# Patient Record
Sex: Male | Born: 1980
Health system: Southern US, Community
[De-identification: ages and names within clinical notes are randomized; demographics above are authoritative.]

## PROBLEM LIST (undated history)

## (undated) DIAGNOSIS — G4733 Obstructive sleep apnea (adult) (pediatric): Secondary | ICD-10-CM

## (undated) DIAGNOSIS — T7840XA Allergy, unspecified, initial encounter: Secondary | ICD-10-CM

## (undated) DIAGNOSIS — B019 Varicella without complication: Secondary | ICD-10-CM

## (undated) DIAGNOSIS — N200 Calculus of kidney: Secondary | ICD-10-CM

## (undated) HISTORY — DX: Calculus of kidney: N20.0

## (undated) HISTORY — DX: Obstructive sleep apnea (adult) (pediatric): G47.33

## (undated) HISTORY — DX: Varicella without complication: B01.9

## (undated) HISTORY — DX: Allergy, unspecified, initial encounter: T78.40XA

## (undated) HISTORY — PX: HERNIA REPAIR: SHX51

---
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
## 1998-02-17 HISTORY — PX: ANTERIOR CRUCIATE LIGAMENT REPAIR: SHX115

## 2008-03-13 ENCOUNTER — Ambulatory Visit: Payer: Self-pay | Admitting: Pulmonary Disease

## 2008-03-13 DIAGNOSIS — G4733 Obstructive sleep apnea (adult) (pediatric): Secondary | ICD-10-CM | POA: Insufficient documentation

## 2008-03-29 ENCOUNTER — Encounter: Payer: Self-pay | Admitting: Pulmonary Disease

## 2008-03-29 ENCOUNTER — Ambulatory Visit (HOSPITAL_BASED_OUTPATIENT_CLINIC_OR_DEPARTMENT_OTHER): Admission: RE | Admit: 2008-03-29 | Discharge: 2008-03-29 | Payer: Self-pay | Admitting: Pulmonary Disease

## 2008-04-05 ENCOUNTER — Ambulatory Visit: Payer: Self-pay | Admitting: Pulmonary Disease

## 2008-04-26 ENCOUNTER — Ambulatory Visit: Payer: Self-pay | Admitting: Pulmonary Disease

## 2010-07-05 NOTE — Procedures (Signed)
Henry Garner, Henry Garner NO.:  [id number]   MEDICAL RECORD NO.:  [id number]          PATIENT TYPE:  OUT   LOCATION:  SLEEP CENTER                 FACILITY:  [facility]   PHYSICIAN:  Barbaraann Share, MD,FCCPDATE OF BIRTH:  03-04-1978   DATE OF STUDY:  04/05/2008                            NOCTURNAL POLYSOMNOGRAM   REFERRING PHYSICIAN:   LOCATION:  Sleep lab.   REFERRING PHYSICIAN:  Dr. Barbaraann Share, MD, [facility].   DATE OF STUDY:  March 29, 2008.   INDICATION FOR THE STUDY:  Hypersomnia with sleep apnea.   EPWORTH SCORE:  14.   SLEEP ARCHITECTURE:  The patient had a total sleep time of only 143  minutes with no slow wave sleep or REM.  Sleep onset latency was very  prolonged at 133 minutes, and sleep efficiency was very poor at 40%.   RESPIRATORY DATA:  The patient was found to have 8 apneas and 40  obstructive hypopneas for an apnea/hypopnea index of 20 events per hour.  The events occurred in all body positions and there was loud snoring  noted throughout.   OXYGEN DATA:  There was O2 desaturation as low as 88% with the patient's  obstructive events.   CARDIAC DATA:  No clinically significant arrhythmias were noted.   MOVEMENT/PARASOMNIA:  The patient had no significant leg jerks or  abnormal behavior seen.   IMPRESSION/RECOMMENDATIONS:  1. Moderate obstructive sleep apnea/hypopnea syndrome with an apnea-      hypopnea index of 20 events per hour and desaturation as low as      88%.  It should also be noted the patient never achieved REM or      slow wave sleep      and therefore his degree of sleep apnea may be underestimated.      Treatment for this degree of sleep apnea can include weight loss      alone if applicable, upper airway surgery, oral appliance, and also      CPAP.      Barbaraann Share, MD,FCCP  Diplomate, American Board of Sleep  Medicine  Electronically Signed     [facility]/MED[facility]  D:  04/05/2008 12:47:31  T:  04/06/2008 00:43:30  Job:   [id number]

## 2013-02-17 HISTORY — PX: VASECTOMY: SHX75

## 2014-06-18 ENCOUNTER — Encounter (HOSPITAL_BASED_OUTPATIENT_CLINIC_OR_DEPARTMENT_OTHER): Payer: Self-pay | Admitting: Emergency Medicine

## 2014-06-18 ENCOUNTER — Emergency Department (HOSPITAL_BASED_OUTPATIENT_CLINIC_OR_DEPARTMENT_OTHER): Payer: BLUE CROSS/BLUE SHIELD

## 2014-06-18 ENCOUNTER — Emergency Department (HOSPITAL_BASED_OUTPATIENT_CLINIC_OR_DEPARTMENT_OTHER)
Admission: EM | Admit: 2014-06-18 | Discharge: 2014-06-18 | Disposition: A | Discharge status: Home / Self Care | Payer: BLUE CROSS/BLUE SHIELD | Attending: Emergency Medicine | Admitting: Emergency Medicine

## 2014-06-18 DIAGNOSIS — R109 Unspecified abdominal pain: Secondary | ICD-10-CM | POA: Diagnosis present

## 2014-06-18 DIAGNOSIS — Z880 Allergy status to penicillin: Secondary | ICD-10-CM | POA: Insufficient documentation

## 2014-06-18 DIAGNOSIS — N201 Calculus of ureter: Secondary | ICD-10-CM

## 2014-06-18 DIAGNOSIS — Z791 Long term (current) use of non-steroidal anti-inflammatories (NSAID): Secondary | ICD-10-CM | POA: Insufficient documentation

## 2014-06-18 LAB — CBC WITH DIFFERENTIAL/PLATELET
BASOS ABS: 0 10*3/uL (ref 0.0–0.1)
Basophils Relative: 0 % (ref 0–1)
EOS PCT: 0 % (ref 0–5)
Eosinophils Absolute: 0 10*3/uL (ref 0.0–0.7)
HEMATOCRIT: 44.7 % (ref 39.0–52.0)
HEMOGLOBIN: 15.5 g/dL (ref 13.0–17.0)
LYMPHS PCT: 14 % (ref 12–46)
Lymphs Abs: 1.6 10*3/uL (ref 0.7–4.0)
MCH: 29.6 pg (ref 26.0–34.0)
MCHC: 34.7 g/dL (ref 30.0–36.0)
MCV: 85.5 fL (ref 78.0–100.0)
Monocytes Absolute: 0.6 10*3/uL (ref 0.1–1.0)
Monocytes Relative: 5 % (ref 3–12)
NEUTROS ABS: 9.3 10*3/uL — AB (ref 1.7–7.7)
Neutrophils Relative %: 81 % — ABNORMAL HIGH (ref 43–77)
Platelets: 207 10*3/uL (ref 150–400)
RBC: 5.23 MIL/uL (ref 4.22–5.81)
RDW: 13 % (ref 11.5–15.5)
WBC: 11.5 10*3/uL — AB (ref 4.0–10.5)

## 2014-06-18 LAB — URINALYSIS, ROUTINE W REFLEX MICROSCOPIC
Bilirubin Urine: NEGATIVE
GLUCOSE, UA: NEGATIVE mg/dL
Ketones, ur: NEGATIVE mg/dL
Leukocytes, UA: NEGATIVE
NITRITE: NEGATIVE
Protein, ur: NEGATIVE mg/dL
SPECIFIC GRAVITY, URINE: 1.026 (ref 1.005–1.030)
UROBILINOGEN UA: 0.2 mg/dL (ref 0.0–1.0)
pH: 6 (ref 5.0–8.0)

## 2014-06-18 LAB — BASIC METABOLIC PANEL
Anion gap: 4 — ABNORMAL LOW (ref 5–15)
BUN: 17 mg/dL (ref 6–20)
CALCIUM: 8.9 mg/dL (ref 8.9–10.3)
CO2: 26 mmol/L (ref 22–32)
CREATININE: 0.91 mg/dL (ref 0.61–1.24)
Chloride: 105 mmol/L (ref 101–111)
GFR calc non Af Amer: >60 mL/min (ref >60)
Glucose, Bld: 139 mg/dL — ABNORMAL HIGH (ref 70–99)
Potassium: 4.2 mmol/L (ref 3.5–5.1)
Sodium: 135 mmol/L (ref 135–145)

## 2014-06-18 LAB — URINE MICROSCOPIC-ADD ON

## 2014-06-18 MED ORDER — ONDANSETRON HCL 4 MG/2ML IJ SOLN
4.0000 mg | Freq: Once | INTRAMUSCULAR | Status: AC
  Administered 2014-06-18: 4 mg via INTRAVENOUS
  Filled 2014-06-18: qty 2

## 2014-06-18 MED ORDER — TAMSULOSIN HCL 0.4 MG PO CAPS: 0.4000 mg | ORAL_CAPSULE | Freq: Every day | ORAL | Status: DC

## 2014-06-18 MED ORDER — MORPHINE SULFATE 4 MG/ML IJ SOLN
4.0000 mg | Freq: Once | INTRAMUSCULAR | Status: AC
  Administered 2014-06-18: 4 mg via INTRAVENOUS
  Filled 2014-06-18: qty 1

## 2014-06-18 MED ORDER — OXYCODONE-ACETAMINOPHEN 5-325 MG PO TABS: 1.0000 | ORAL_TABLET | Freq: Four times a day (QID) | ORAL | Status: DC | PRN

## 2014-06-18 MED ORDER — HYDROMORPHONE HCL 1 MG/ML IJ SOLN
1.0000 mg | Freq: Once | INTRAMUSCULAR | Status: AC
  Administered 2014-06-18: 1 mg via INTRAVENOUS
  Filled 2014-06-18: qty 1

## 2014-06-18 MED ORDER — ONDANSETRON HCL 4 MG PO TABS: 4.0000 mg | ORAL_TABLET | Freq: Three times a day (TID) | ORAL | Status: DC | PRN

## 2014-06-18 MED ORDER — SODIUM CHLORIDE 0.9 % IV BOLUS (SEPSIS)
1000.0000 mL | Freq: Once | INTRAVENOUS | Status: AC
  Administered 2014-06-18: 1000 mL via INTRAVENOUS

## 2014-06-18 NOTE — ED Notes (Signed)
Pt reports sudden onset right flank pain denies hx kidney stone

## 2014-06-18 NOTE — ED Provider Notes (Signed)
CSN: [id number]     Arrival date & time 06/18/14  4098 History   First MD Initiated Contact with Patient 06/18/14 [phone]     Chief Complaint  Patient presents with  . Flank Pain     (Consider location/radiation/quality/duration/timing/severity/associated sxs/prior Treatment) Patient is a 34 y.o. male presenting with abdominal pain.  Abdominal Pain Pain location:  R flank Pain quality: aching   Pain radiates to:  Groin Pain severity:  Severe Onset quality:  Sudden Duration:  5 hours Timing:  Constant Progression:  Partially resolved Chronicity:  New Context: awakening from sleep   Relieved by: ibuprofen. Worsened by:  Nothing tried Associated symptoms: dysuria, nausea and vomiting   Associated symptoms: no fever and no hematuria     History reviewed. No pertinent past medical history. Past Surgical History  Procedure Laterality Date  . Hernia repair    . Cataract extraction    . Anterior cruciate ligament repair    . Hernia repair     History reviewed. No pertinent family history. History  Substance Use Topics  . Smoking status: Never Smoker   . Smokeless tobacco: Never Used  . Alcohol Use: No    Review of Systems  Constitutional: Negative for fever.  Gastrointestinal: Positive for nausea, vomiting and abdominal pain.  Genitourinary: Positive for dysuria and flank pain. Negative for hematuria.  All other systems reviewed and are negative.     Allergies  Amoxapine and related and Penicillins  Home Medications   Prior to Admission medications   Medication Sig Start Date End Date Taking? Authorizing Provider  ibuprofen (ADVIL,MOTRIN) 200 MG tablet Take 800 mg by mouth every 6 (six) hours as needed.   Yes Historical Provider, MD   BP 144/78 mmHg  Pulse 84  Temp(Src) 98.2 F (36.8 C) (Oral)  Resp 20  Ht 5' 10.5" (1.791 m)  Wt 280 lb (127.007 kg)  BMI 39.59 kg/m2  SpO2 100% Physical Exam  Constitutional: He is oriented to person, place, and time. He  appears well-developed and well-nourished. No distress.  HENT:  Head: Normocephalic and atraumatic.  Eyes: Conjunctivae are normal. No scleral icterus.  Neck: Neck supple.  Cardiovascular: Normal rate and intact distal pulses.   Pulmonary/Chest: Effort normal. No stridor. No respiratory distress.  Abdominal: Normal appearance. He exhibits no distension. There is no tenderness. There is CVA tenderness (minimal right). There is no rigidity, no rebound and no guarding.  Neurological: He is alert and oriented to person, place, and time.  Skin: Skin is warm and dry. No rash noted.  Psychiatric: He has a normal mood and affect. His behavior is normal.  Nursing note and vitals reviewed.   ED Course  Procedures (including critical care time) Labs Review Labs Reviewed  URINALYSIS, ROUTINE W REFLEX MICROSCOPIC - Abnormal; Notable for the following:    APPearance CLOUDY (*)    Hgb urine dipstick LARGE (*)    All other components within normal limits  CBC WITH DIFFERENTIAL/PLATELET - Abnormal; Notable for the following:    WBC 11.5 (*)    Neutrophils Relative % 81 (*)    Neutro Abs 9.3 (*)    All other components within normal limits  BASIC METABOLIC PANEL - Abnormal; Notable for the following:    Glucose, Bld 139 (*)    Anion gap 4 (*)    All other components within normal limits  URINE MICROSCOPIC-ADD ON    Imaging Review Ct Renal Stone Study  06/18/2014   CLINICAL DATA:  Acute onset right  flank pain for 1 day.  Hematuria.  EXAM: CT ABDOMEN AND PELVIS WITHOUT CONTRAST  TECHNIQUE: Multidetector CT imaging of the abdomen and pelvis was performed following the standard protocol without oral or intravenous contrast material administration.  COMPARISON:  None.  FINDINGS: There is no edema or consolidation in the lung bases.  No focal liver lesions are identified on this noncontrast enhanced study. Gallbladder wall is not thickened. There is no biliary duct dilatation.  Spleen, pancreas, and  adrenals appear normal.  Left kidney shows no evidence of mass or hydronephrosis. There is no left-sided renal or ureteral calculus. On the right, there is mild hydronephrosis. There is no renal mass. There is a 2 mm nonobstructing calculus in the upper pole right kidney. There is a 2 mm calculus in the right ureterovesical junction. There is a 6 mm phleboliths slightly inferior to the right ureter.  In the pelvis, the urinary bladder is midline with normal wall thickness. There is no pelvic mass or pelvic fluid collection. The appendix appears normal.  There is no bowel obstruction. No free air or portal venous air. There is no appreciable ascites, adenopathy, or abscess in the abdomen or pelvis. There is no abdominal aortic aneurysm. There are no blastic or lytic bone lesions.  IMPRESSION: 2 mm calculus right ureterovesical junction causing mild hydronephrosis on the right. There is a nonobstructing 2 mm calculus upper pole right kidney.  No bowel obstruction.  No abscess.  Appendix appears normal.   Electronically Signed   By: Bretta BangWilliam  Woodruff III M.D.   On: 06/18/2014 08:01  All radiology studies independently viewed by me.      EKG Interpretation None      MDM   Final diagnoses:  Right flank pain  Right ureteral stone    History is quite convincing for kidney stone.  Pain controlled with IV dilaudid.  CT shows 2mm UVJ stone.  Plan dc with follow up with Urology.    Blake DivineJohn Blakeleigh Domek, MD 06/18/14 [id number]

## 2014-06-18 NOTE — Discharge Instructions (Signed)
Kidney Stones  Kidney stones (urolithiasis) are deposits that form inside your kidneys. The intense pain is caused by the stone moving through the urinary tract. When the stone moves, the ureter goes into spasm around the stone. The stone is usually passed in the urine.   CAUSES   · A disorder that makes certain neck glands produce too much parathyroid hormone (primary hyperparathyroidism).  · A buildup of uric acid crystals, similar to gout in your joints.  · Narrowing (stricture) of the ureter.  · A kidney obstruction present at birth (congenital obstruction).  · Previous surgery on the kidney or ureters.  · Numerous kidney infections.  SYMPTOMS   · Feeling sick to your stomach (nauseous).  · Throwing up (vomiting).  · Blood in the urine (hematuria).  · Pain that usually spreads (radiates) to the groin.  · Frequency or urgency of urination.  DIAGNOSIS   · Taking a history and physical exam.  · Blood or urine tests.  · CT scan.  · Occasionally, an examination of the inside of the urinary bladder (cystoscopy) is performed.  TREATMENT   · Observation.  · Increasing your fluid intake.  · Extracorporeal shock wave lithotripsy--This is a noninvasive procedure that uses shock waves to break up kidney stones.  · Surgery may be needed if you have severe pain or persistent obstruction. There are various surgical procedures. Most of the procedures are performed with the use of small instruments. Only small incisions are needed to accommodate these instruments, so recovery time is minimized.  The size, location, and chemical composition are all important variables that will determine the proper choice of action for you. Talk to your health care provider to better understand your situation so that you will minimize the risk of injury to yourself and your kidney.   HOME CARE INSTRUCTIONS   · Drink enough water and fluids to keep your urine clear or pale yellow. This will help you to pass the stone or stone fragments.  · Strain  all urine through the provided strainer. Keep all particulate matter and stones for your health care provider to see. The stone causing the pain may be as small as a grain of salt. It is very important to use the strainer each and every time you pass your urine. The collection of your stone will allow your health care provider to analyze it and verify that a stone has actually passed. The stone analysis will often identify what you can do to reduce the incidence of recurrences.  · Only take over-the-counter or prescription medicines for pain, discomfort, or fever as directed by your health care provider.  · Make a follow-up appointment with your health care provider as directed.  · Get follow-up X-rays if required. The absence of pain does not always mean that the stone has passed. It may have only stopped moving. If the urine remains completely obstructed, it can cause loss of kidney function or even complete destruction of the kidney. It is your responsibility to make sure X-rays and follow-ups are completed. Ultrasounds of the kidney can show blockages and the status of the kidney. Ultrasounds are not associated with any radiation and can be performed easily in a matter of minutes.  SEEK MEDICAL CARE IF:  · You experience pain that is progressive and unresponsive to any pain medicine you have been prescribed.  SEEK IMMEDIATE MEDICAL CARE IF:   · Pain cannot be controlled with the prescribed medicine.  · You have a fever or   shaking chills.  · The severity or intensity of pain increases over 18 hours and is not relieved by pain medicine.  · You develop a new onset of abdominal pain.  · You feel faint or pass out.  · You are unable to urinate.  MAKE SURE YOU:   · Understand these instructions.  · Will watch your condition.  · Will get help right away if you are not doing well or get worse.  Document Released: 02/03/2005 Document Revised: 10/06/2012 Document Reviewed: 07/07/2012  ExitCare® Patient Information ©2015  ExitCare, LLC. This information is not intended to replace advice given to you by your health care provider. Make sure you discuss any questions you have with your health care provider.    Dietary Guidelines to Help Prevent Kidney Stones  Your risk of kidney stones can be decreased by adjusting the foods you eat. The most important thing you can do is drink enough fluid. You should drink enough fluid to keep your urine clear or pale yellow. The following guidelines provide specific information for the type of kidney stone you have had.  GUIDELINES ACCORDING TO TYPE OF KIDNEY STONE  Calcium Oxalate Kidney Stones  · Reduce the amount of salt you eat. Foods that have a lot of salt cause your body to release excess calcium into your urine. The excess calcium can combine with a substance called oxalate to form kidney stones.  · Reduce the amount of animal protein you eat if the amount you eat is excessive. Animal protein causes your body to release excess calcium into your urine. Ask your dietitian how much protein from animal sources you should be eating.  · Avoid foods that are high in oxalates. If you take vitamins, they should have less than 500 mg of vitamin C. Your body turns vitamin C into oxalates. You do not need to avoid fruits and vegetables high in vitamin C.  Calcium Phosphate Kidney Stones  · Reduce the amount of salt you eat to help prevent the release of excess calcium into your urine.  · Reduce the amount of animal protein you eat if the amount you eat is excessive. Animal protein causes your body to release excess calcium into your urine. Ask your dietitian how much protein from animal sources you should be eating.  · Get enough calcium from food or take a calcium supplement (ask your dietitian for recommendations). Food sources of calcium that do not increase your risk of kidney stones include:  ¨ Broccoli.  ¨ Dairy products, such as cheese and yogurt.  ¨ Pudding.  Uric Acid Kidney Stones  · Do not  have more than 6 oz of animal protein per day.  FOOD SOURCES  Animal Protein Sources  · Meat (all types).  · Poultry.  · Eggs.  · Fish, seafood.  Foods High in Salt  · Salt seasonings.  · Soy sauce.  · Teriyaki sauce.  · Cured and processed meats.  · Salted crackers and snack foods.  · Fast food.  · Canned soups and most canned foods.  Foods High in Oxalates  · Grains:  ¨ Amaranth.  ¨ Barley.  ¨ Grits.  ¨ Wheat germ.  ¨ Bran.  ¨ Buckwheat flour.  ¨ All bran cereals.  ¨ Pretzels.  ¨ Whole wheat bread.  · Vegetables:  ¨ Beans (wax).  ¨ Beets and beet greens.  ¨ Collard greens.  ¨ Eggplant.  ¨ Escarole.  ¨ Leeks.  ¨ Okra.  ¨ Parsley.  ¨ Rutabagas.  ¨   Spinach.  ¨ Swiss chard.  ¨ Tomato paste.  ¨ Fried potatoes.  ¨ Sweet potatoes.  · Fruits:  ¨ Red currants.  ¨ Figs.  ¨ Kiwi.  ¨ Rhubarb.  · Meat and Other Protein Sources:  ¨ Beans (dried).  ¨ Soy burgers and other soybean products.  ¨ Miso.  ¨ Nuts (peanuts, almonds, pecans, cashews, hazelnuts).  ¨ Nut butters.  ¨ Sesame seeds and tahini (paste made of sesame seeds).  ¨ Poppy seeds.  · Beverages:  ¨ Chocolate drink mixes.  ¨ Soy milk.  ¨ Instant iced tea.  ¨ Juices made from high-oxalate fruits or vegetables.  · Other:  ¨ Carob.  ¨ Chocolate.  ¨ Fruitcake.  ¨ Marmalades.  Document Released: 05/31/2010 Document Revised: 02/08/2013 Document Reviewed: 12/31/2012  ExitCare® Patient Information ©2015 ExitCare, LLC. This information is not intended to replace advice given to you by your health care provider. Make sure you discuss any questions you have with your health care provider.

## 2014-06-23 ENCOUNTER — Telehealth: Payer: Self-pay | Admitting: General Practice

## 2014-06-23 NOTE — Telephone Encounter (Signed)
Patient's wife would like to establish patient with Dr. Durene CalHunter.  Can I use 07/18/14 at 0930??  Please advise, patient has [employer].

## 2014-07-14 NOTE — Telephone Encounter (Signed)
Please advise if I can work patient in to establish as a new patient.  The original message was not routed in error.

## 2014-07-14 NOTE — Telephone Encounter (Signed)
Yes you can. Thanks Adair LaundryLatonya- make sure there are at least 3 SDA's left on day you work him in. Thanks!

## 2014-07-14 NOTE — Telephone Encounter (Signed)
LMOM for wife to call back for new patient appt.

## 2014-07-14 NOTE — Telephone Encounter (Signed)
done

## 2014-08-11 ENCOUNTER — Ambulatory Visit (INDEPENDENT_AMBULATORY_CARE_PROVIDER_SITE_OTHER): Payer: BLUE CROSS/BLUE SHIELD | Admitting: Family Medicine

## 2014-08-11 ENCOUNTER — Encounter: Payer: Self-pay | Admitting: Family Medicine

## 2014-08-11 VITALS — BP 130/80 | HR 88 | Temp 99.3°F | Ht 70.0 in | Wt 282.0 lb

## 2014-08-11 DIAGNOSIS — Z0000 Encounter for general adult medical examination without abnormal findings: Secondary | ICD-10-CM | POA: Diagnosis not present

## 2014-08-11 DIAGNOSIS — M7662 Achilles tendinitis, left leg: Secondary | ICD-10-CM

## 2014-08-11 DIAGNOSIS — Z7251 High risk heterosexual behavior: Secondary | ICD-10-CM | POA: Diagnosis not present

## 2014-08-11 NOTE — Patient Instructions (Addendum)
I wonder if your sinus symptoms are more of an atypical presentation of allergies. Let's try allegra, claritin or zyrtec for at least a 3 week trial  Schedule a lab visit at the front desk. Return for future fasting labs. Nothing but water after midnight please.   For your left achilles- We will call you within a week about your referral to Dr. Katrinka Blazing of Sports medicine. If you do not hear within 2 weeks, give [place] a call.   Sign release of information at the front desk for records from [facility]. You may end up needing a tetanus shot. Let's get last 2 year of labs and office visits as well as any immunizations.

## 2014-08-11 NOTE — Progress Notes (Signed)
Henry Conch, MD Phone: [phone]  Subjective:  Patient presents today to establish care and for annual physical. Formerly a patient at [facility] in [place] (last seen 2010).  Chief complaint-noted.   Patient has had congestion, runny nose for some time now. About a month ago treated with prednisone and z pak that didn't clear symptoms. Then took another course of prednisone and chlarithromycin (finished middle of last week). Didn't seem to completely clear it. Some spots on back of his tongue with an ache as well as drainage. No watery itchy eyes, sneezing, cough.   Knot left achilles for 2-3 years. No history of injury or trauma. 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 Denies pain.   Peak weight 288, down 6 lbs. At one point had lost over 60 lbs.  Goes to gym 3-4x a week including cardio and free weights. Working on his weight. Head golf professional.   7-8 hours sleep nightly  Fasting today and agrees to labs.   ROS- no chest pain, shortness of breath, fever, nausea, vomiting, diarrhea  The following were reviewed and entered/updated in epic: Past Medical History  Diagnosis Date  . Kidney stone     1 cm stone left in kidney.   . Chicken pox   . OSA (obstructive sleep apnea)     borderline, advised weight loss   There are no active problems to display for this patient.  Past Surgical History  Procedure Laterality Date  . 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 Anterior cruciate ligament repair  2000    basketball in college  . Hernia repair  1980    as child  . Vasectomy  2015    Family History  Problem Relation Age of Onset  . Hearing loss Father   . Alzheimer's disease Paternal Grandfather   . Other Father     EP issues- Vtach  . Hyperthyroidism Mother     s/p removal- not cancerous    Medications- reviewed and updated Current Outpatient Prescriptions  Medication Sig Dispense Refill    . ibuprofen (ADVIL,MOTRIN) 200 MG tablet Take 800 mg by mouth every 6 (six) hours as needed.     No current facility-administered medications for this visit.    Allergies-reviewed and updated Allergies  Allergen Reactions  . Amoxicillin Hives    History   Social History  . Marital Status: Married    Spouse Name: N/A  . Number of Children: N/A  . Years of Education: N/A   Social History Main Topics  . Smoking status: Never Smoker   . Smokeless tobacco: Never Used  . Alcohol Use: 0.0 oz/week    0 Standard drinks or equivalent per week     Comment: 2-3x a month  . Drug Use: No  . Sexual Activity: Yes   Other Topics Concern  . 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 [employer] Wife went to [employer].       Hobbies: time with family, kids getting interested in golfing, singing at [employer]- season tickets to [place]    ROS--See HPI , otherwise full ROS was completed and negative except as noted above  Objective: BP 130/80 mmHg  Pulse 88  Temp(Src) 99.3 F (37.4 C)  Ht 5\' 10"  (1.778  m)  Wt 282 lb (127.914 kg)  BMI 40.46 kg/m2 Gen: NAD, resting comfortably HEENT: Mucous membranes are moist. Oropharynx normal. TM normal. Eyes: sclera and lids normal, PERRLA Neck: no thyromegaly, no cervical lymphadenopathy CV: RRR no murmurs rubs or gallops Lungs: CTAB no crackles, wheeze, rhonchi Abdomen: soft/nontender/nondistended/normal bowel sounds. No rebound or guarding. Obese.  Ext: no edema MSK: thickening of Left achilles with some mild associated swelling also noted compared to right Skin: warm, dry Neuro: 5/5 strength in upper and lower extremities, normal gait, normal reflexes   Assessment/Plan:  34 y.o. male presenting for annual physical.  Health Maintenance counseling: 1. Anticipatory guidance: Patient counseled regarding regular dental exams,  wearing seatbelts.  2. Risk factor reduction:  Advised patient of need for regular exercise and diet rich and fruits and vegetables to reduce risk of heart attack and stroke.  3. Immunizations/screenings/ancillary studies Health Maintenance Due  Topic Date Due  . HIV Screening - obtain with future fasting labs 08/22/1993  . TETANUS/TDAP - get records 08/22/1997  4. Return for baseline future fasting labs  For achilles thickening, wonder if this is a chronic tendinopathy. 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 Terrilee Files of sports medicine for further evaluation as remaining on his feet (although currently asymptomatic) is essential for his career.  We discussed wanting to take a preventative approach here.   Congestion for months now improved but with some residual symptoms and signs of drainage. I wonder if allergic rhinitis is cause- we will use an antihistamine for 3 weeks or so and return if no improvement  We also had a long discussion about need for weight loss. He has had fluctuating weight but recently has begun exercising and eating better. We discussed importance of maintaining this long term for his heatlh. j  Orders Placed This Encounter  Procedures  . CBC    [place]    Standing Status: Future     Number of Occurrences:      Standing Expiration Date: 08/11/2015  . Comprehensive metabolic panel    [place]    Standing Status: Future     Number of Occurrences:      Standing Expiration Date: 08/11/2015    Order Specific Question:  Has the patient fasted?    Answer:  No  . Lipid panel    [facility]    Standing Status: Future     Number of Occurrences:      Standing Expiration Date: 08/11/2015    Order Specific Question:  Has the patient fasted?    Answer:  No  . TSH    [place]    Standing Status: Future     Number of Occurrences:      Standing Expiration Date: 08/11/2015  . HIV antibody    [facility]    Standing Status: Future     Number of Occurrences:       Standing Expiration Date: 08/11/2015  . Ambulatory referral to Sports Medicine    Referral Priority:  Routine    Referral Type:  Consultation    Referred to Provider:  Judi Saa, DO    Number of Visits Requested:  1

## 2014-08-17 ENCOUNTER — Other Ambulatory Visit (INDEPENDENT_AMBULATORY_CARE_PROVIDER_SITE_OTHER): Payer: BLUE CROSS/BLUE SHIELD

## 2014-08-17 DIAGNOSIS — Z7251 High risk heterosexual behavior: Secondary | ICD-10-CM

## 2014-08-17 DIAGNOSIS — Z0000 Encounter for general adult medical examination without abnormal findings: Secondary | ICD-10-CM | POA: Diagnosis not present

## 2014-08-17 LAB — TSH: TSH: 0.74 u[IU]/mL (ref 0.35–4.50)

## 2014-08-17 LAB — CBC
HCT: 44.8 % (ref 39.0–52.0)
Hemoglobin: 15.4 g/dL (ref 13.0–17.0)
MCHC: 34.4 g/dL (ref 30.0–36.0)
MCV: 84.6 fl (ref 78.0–100.0)
PLATELETS: 211 10*3/uL (ref 150.0–400.0)
RBC: 5.29 Mil/uL (ref 4.22–5.81)
RDW: 13.6 % (ref 11.5–15.5)
WBC: 9.3 10*3/uL (ref 4.0–10.5)

## 2014-08-17 LAB — COMPREHENSIVE METABOLIC PANEL
ALT: 19 U/L (ref 0–53)
AST: 20 U/L (ref 0–37)
Albumin: 4 g/dL (ref 3.5–5.2)
Alkaline Phosphatase: 86 U/L (ref 39–117)
BILIRUBIN TOTAL: 0.6 mg/dL (ref 0.2–1.2)
BUN: 14 mg/dL (ref 6–23)
CHLORIDE: 104 meq/L (ref 96–112)
CO2: 31 meq/L (ref 19–32)
CREATININE: 0.9 mg/dL (ref 0.40–1.50)
Calcium: 9.4 mg/dL (ref 8.4–10.5)
GFR: 101.49 mL/min (ref >60.00)
Glucose, Bld: 116 mg/dL — ABNORMAL HIGH (ref 70–99)
POTASSIUM: 4.6 meq/L (ref 3.5–5.1)
SODIUM: 140 meq/L (ref 135–145)
TOTAL PROTEIN: 6.8 g/dL (ref 6.0–8.3)

## 2014-08-17 LAB — LIPID PANEL
Cholesterol: 222 mg/dL — ABNORMAL HIGH (ref 0–200)
HDL: 42.5 mg/dL (ref >39.00)
LDL CALC: 144 mg/dL — AB (ref 0–99)
NONHDL: 179.5
Total CHOL/HDL Ratio: 5
Triglycerides: 176 mg/dL — ABNORMAL HIGH (ref 0.0–149.0)
VLDL: 35.2 mg/dL (ref 0.0–40.0)

## 2014-08-18 LAB — HIV ANTIBODY (ROUTINE TESTING W REFLEX): HIV 1&2 Ab, 4th Generation: NONREACTIVE

## 2014-08-28 ENCOUNTER — Other Ambulatory Visit (INDEPENDENT_AMBULATORY_CARE_PROVIDER_SITE_OTHER): Payer: BLUE CROSS/BLUE SHIELD

## 2014-08-28 ENCOUNTER — Ambulatory Visit (INDEPENDENT_AMBULATORY_CARE_PROVIDER_SITE_OTHER): Payer: BLUE CROSS/BLUE SHIELD | Admitting: Family Medicine

## 2014-08-28 ENCOUNTER — Encounter: Payer: Self-pay | Admitting: Family Medicine

## 2014-08-28 VITALS — BP 134/82 | HR 84 | Ht 70.0 in | Wt 283.0 lb

## 2014-08-28 DIAGNOSIS — M25572 Pain in left ankle and joints of left foot: Secondary | ICD-10-CM

## 2014-08-28 DIAGNOSIS — M7711 Lateral epicondylitis, right elbow: Secondary | ICD-10-CM

## 2014-08-28 DIAGNOSIS — M67972 Unspecified disorder of synovium and tendon, left ankle and foot: Secondary | ICD-10-CM | POA: Diagnosis not present

## 2014-08-28 DIAGNOSIS — M67979 Unspecified disorder of synovium and tendon, unspecified ankle and foot: Secondary | ICD-10-CM | POA: Insufficient documentation

## 2014-08-28 MED ORDER — DICLOFENAC SODIUM 2 % TD SOLN: TRANSDERMAL | Status: DC

## 2014-08-28 NOTE — Assessment & Plan Note (Signed)
Patient home exercises, heel lift, icing, and topical anti-inflammatory. We discussed what activities to potentially avoid. Patient will come back and see me again in 3 weeks

## 2014-08-28 NOTE — Progress Notes (Signed)
Pre visit review using our clinic review tool, if applicable. No additional management support is needed unless otherwise documented below in the visit note. 

## 2014-08-28 NOTE — Assessment & Plan Note (Signed)
Lateral Epicondylitis: Elbow anatomy was reviewed, and tendinopathy was explained.  Pt. given a formal rehab program. Series of concentric and eccentric exercises should be done starting with no weight, work up to 1 lb, hammer, etc.  Use counterforce strap if working or using hands.  Formal PT would be beneficial. Emphasized stretching an cross-friction massage Emphasized proper palms up lifting biomechanics to unload ECRB  

## 2014-08-28 NOTE — Patient Instructions (Signed)
Good to see you Ice 20 minutes 2 times daily. Usually after activity and before bed. Ice bath at night can help. pennsaid pinkie amount topically 2 times daily as needed.  Heel lift in left shoe during the day and with exercise Exercises 3 times a week. Alternate elbow and the achilles See me again in 3-4 weeks.

## 2014-08-28 NOTE — Progress Notes (Signed)
Henry Garner D.O. [facility] [address] [place], [place] [address] Phone: [phone] Subjective:    I'm seeing this patient by the request  of:  Tana ConchHUNTER, STEPHEN, MD   CC: left posterior ankle pain.  BJY:NWGNFAOZHYHPI:Subjective Henry Garner is a 34 y.o. male coming in with complaint of left ankle pain. Patient states that he has had tenderness over the Achilles tendon for multiple years. Has intermittent times when it seems to be worse. Patient is an avid golfer and states increasing his activity again. Patient has been working on a regular basis as well. Patient does usually in our cardiac 3 times a week. Patient states that the pain is mostly in the posterior aspect of the ankle. Patient states that it seems to be more uncomfortable after the activities and actually truly with the activity. States that in the morning and can be very tight as well. Denies any significant swelling or any discoloration and does not remember any true injury. Rates the severity of pain when it occurs is 5 out of 10. Still able to do daily activities.  Patient is also complaining of right elbow pain. States that it seems to be on the lateral aspect. Worse with lifting things. Rates the severity of pain as 5 out of 10. Denies any numbness or weakness. Patient states usually lifting something fairly heavy seems to make it worse.  Past Medical History  Diagnosis Date  . Kidney stone     1 cm stone left in kidney.   . Chicken pox   . OSA (obstructive sleep apnea)     borderline, advised weight loss   Past Surgical History  Procedure Laterality Date  . 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 Anterior cruciate ligament repair  2000    basketball in college  . Hernia repair  1980    as child  . Vasectomy  2015   History  Substance Use Topics  . Smoking status: Never Smoker   . Smokeless tobacco: Never Used  . Alcohol Use: 0.0 oz/week    0 Standard drinks or  equivalent per week     Comment: 2-3x a month   Allergies  Allergen Reactions  . Amoxicillin Hives   Family History  Problem Relation Age of Onset  . Hearing loss Father   . Alzheimer's disease Paternal Grandfather   . Other Father     EP issues- Vtach  . Hyperthyroidism Mother     s/p removal- not cancerous        Past medical history, social, surgical and family history all reviewed in electronic medical record.   Review of Systems: No headache, visual changes, nausea, vomiting, diarrhea, constipation, dizziness, abdominal pain, skin rash, fevers, chills, night sweats, weight loss, swollen lymph nodes, body aches, joint swelling, muscle aches, chest pain, shortness of breath, mood changes.   Objective Blood pressure 134/82, pulse 84, height 5\' 10"  (1.778 m), weight 283 lb (128.368 kg), SpO2 97 %.  General: No apparent distress alert and oriented x3 mood and affect normal, dressed appropriately.  HEENT: Pupils equal, extraocular movements intact  Respiratory: Patient's speak in full sentences and does not appear short of breath  Cardiovascular: No lower extremity edema, non tender, no erythema  Skin: Warm dry intact with no signs of infection or rash on extremities or on axial skeleton.  Abdomen: Soft nontender  Neuro: Cranial nerves II through XII are intact, neurovascularly intact in all extremities with 2+  DTRs and 2+ pulses.  Lymph: No lymphadenopathy of posterior or anterior cervical chain or axillae bilaterally.  Gait normal with good balance and coordination.  MSK:  Non tender with full range of motion and good stability and symmetric strength and tone of shoulders,  wrist, hip, knees bilaterally.  Elbow:right Unremarkable to inspection. Range of motion full pronation, supination, flexion, extension. Strength is full to all of the above directions Stable to varus, valgus stress. Negative moving valgus stress test. Neuro the lateral epicondylar region especially with  resisted extension of the middle finger Ulnar nerve does not sublux. Negative cubital tunnel Tinel's. Contralateral side unremarkable Ankle:left No visible erythema or swelling. Range of motion is full in all directions. Strength is 5/5 in all directions. Stable lateral and medial ligaments; squeeze test and kleiger test unremarkable; Talar dome nontender; No pain at base of 5th MT; No tenderness over cuboid; No tenderness over N spot or navicular prominence No tenderness on posterior aspects of lateral and medial malleolus No sign of peroneal tendon subluxations or tenderness to palpation Negative tarsal tunnel tinel's Achilles nodule noted but nontender. Able to walk 4 steps. Hunter lateral side unremarkable  MSK US performed of: left ankle This study was ordered, performed, and interpreted by Terrilee Files D.O.  Foot/Ankle:   All structures visualized.   Talar dome unremarkable  Ankle mortise without effusion. Peroneus longus and brevis tendons unremarkable on long and transverse views without sheath effusions. Posterior tibialis, flexor hallucis longus, and flexor digitorum longus tendons unremarkable on long and transverse views without sheath effusions. Achilles tendon visualized chronic calcific changes noted at the insertion of the Achilles. Patient does have a nodule proximal a 3 cm from its insertion as well. Mild increasing Doppler flow neovascularization noted.Anterior Talofibular Ligament and Calcaneofibular Ligaments unremarkable and intact. Deltoid Ligament unremarkable and intact. Plantar fascia intact and without effusion, normal thickness. No increased doppler signal, cap sign, or thickening of tibial cortex. Power doppler signal normal.  IMPRESSION:  Chronic Achilles tendinosis with calcific changes and insertion.  Procedure note 97110; 15 minutes spent for Therapeutic exercises as stated in above notes.  This included exercises focusing on stretching,  strengthening, with significant focus on eccentric aspects.- Ankle strengthening that included:  Basic range of motion exercises to allow proper full motion at ankle Stretching of the lower leg and hamstrings  Theraband exercises for the lower leg - inversion, eversion, dorsiflexion and plantarflexion each to be completed with a theraband Balance exercises to increase proprioception Weight bearing exercises to increase strength and balance   Proper technique shown and discussed handout in great detail with ATC.  All questions were discussed and answered.     Impression and Recommendations:     This case required medical decision making of moderate complexity.

## 2014-09-25 ENCOUNTER — Ambulatory Visit (INDEPENDENT_AMBULATORY_CARE_PROVIDER_SITE_OTHER): Payer: BLUE CROSS/BLUE SHIELD | Admitting: Family Medicine

## 2014-09-25 ENCOUNTER — Encounter: Payer: Self-pay | Admitting: Family Medicine

## 2014-09-25 VITALS — BP 122/82 | HR 72 | Wt 288.0 lb

## 2014-09-25 DIAGNOSIS — M7711 Lateral epicondylitis, right elbow: Secondary | ICD-10-CM

## 2014-09-25 DIAGNOSIS — M67972 Unspecified disorder of synovium and tendon, left ankle and foot: Secondary | ICD-10-CM | POA: Diagnosis not present

## 2014-09-25 MED ORDER — NITROGLYCERIN 0.2 MG/HR TD PT24: MEDICATED_PATCH | TRANSDERMAL | Status: DC

## 2014-09-25 NOTE — Progress Notes (Signed)
Pre visit review using our clinic review tool, if applicable. No additional management support is needed unless otherwise documented below in the visit note. 

## 2014-09-25 NOTE — Assessment & Plan Note (Signed)
Patient has made very minimal improvement. Continues to have some mild discomfort. Patient states it does affects some of his daily activities. States that the topical did not help. Patient is going to start nitroglycerin patches and warned of potential side effects. We discussed continuing home exercises in the icing. We discussed bracing as well. Patient will then come back and see me again in 4 weeks. If worsening symptoms we'll need to consider formal physical therapy versus potential injection or immobilization.

## 2014-09-25 NOTE — Patient Instructions (Signed)
Good to see you Continue with the ice We will step it up a notch For the elbow wear the wrist brace day and night for 10 days then nightly for 2 weeks.  For the heel try the other brace Nitroglycerin Protocol   Apply 1/4 nitroglycerin patch to affected area daily.  Change position of patch within the affected area every 24 hours.  You may experience a headache during the first 1-2 weeks of using the patch, these should subside.  If you experience headaches after beginning nitroglycerin patch treatment, you may take your preferred over the counter pain reliever.  Another side effect of the nitroglycerin patch is skin irritation or rash related to patch adhesive.  Please notify our office if you develop more severe headaches or rash, and stop the patch.  Tendon healing with nitroglycerin patch may require 12 to 24 weeks depending on the extent of injury.  Men should not use if taking Viagra, Cialis, or Levitra.   Do not use if you have migraines or rosacea.   See me again in 4 weeks.

## 2014-09-25 NOTE — Assessment & Plan Note (Signed)
Patient does have some lateral epicondylar region tenderness but also posterior interosseous nerve entrapment. We discussed home exercises, patient will immobilize the wrist for the next 10 days 23 hours daily and then nightly for 2 weeks. We discussed icing regimen. We'll plan an education patches. Patient come back in 4 weeks

## 2014-09-25 NOTE — Progress Notes (Signed)
[facility] [address] [place], [place] [address] Phone: [phone] Subjective:    I'm seeing this patient by the request  of:  Tana Conch, MD   CC: left posterior ankle pain. Right elbow pain  BJY:NWGNFAOZHY Henry Garner is a 34 y.o. male coming in with complaint of left ankle pain. Patient states that he has had tenderness over the Achilles tendon for multiple years. Patient was found to have chronic calcific Achilles tendinosis. Patient was given topical anti-inflammatory's, icing protocol, as well as discussed proper home exercises. Patient states he's had no improvement. Patient denies any worsening though. Patient is still able to be active but does have some mild discomfort at times. Stopping him from golfing on a regular basis.   Patient is also complaining of right elbow pain. Patient was also found to have a lateral epicondylitis. Patient has been doing the home exercises fairly regularly. Patient states though that this is not proving and seems to be getting worse. Patient states very active. Has not been watching his lifting mechanics. Denies any new symptoms is worsening a previous symptoms.  Past Medical History  Diagnosis Date  . Kidney stone     1 cm stone left in kidney.   . Chicken pox   . OSA (obstructive sleep apnea)     borderline, advised weight loss   Past Surgical History  Procedure Laterality Date  . 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 Anterior cruciate ligament repair  2000    basketball in college  . Hernia repair  1980    as child  . Vasectomy  2015   History  Substance Use Topics  . Smoking status: Never Smoker   . Smokeless tobacco: Never Used  . Alcohol Use: 0.0 oz/week    0 Standard drinks or equivalent per week     Comment: 2-3x a month   Allergies  Allergen Reactions  . Amoxicillin Hives   Family History  Problem Relation Age of Onset  . Hearing loss  Father   . Alzheimer's disease Paternal Grandfather   . Other Father     EP issues- Vtach  . Hyperthyroidism Mother     s/p removal- not cancerous        Past medical history, social, surgical and family history all reviewed in electronic medical record.   Review of Systems: No headache, visual changes, nausea, vomiting, diarrhea, constipation, dizziness, abdominal pain, skin rash, fevers, chills, night sweats, weight loss, swollen lymph nodes, body aches, joint swelling, muscle aches, chest pain, shortness of breath, mood changes.   Objective Blood pressure 122/82, pulse 72, weight 288 lb (130.636 kg), SpO2 97 %.  General: No apparent distress alert and oriented x3 mood and affect normal, dressed appropriately.  HEENT: Pupils equal, extraocular movements intact  Respiratory: Patient's speak in full sentences and does not appear short of breath  Cardiovascular: No lower extremity edema, non tender, no erythema  Skin: Warm dry intact with no signs of infection or rash on extremities or on axial skeleton.  Abdomen: Soft nontender  Neuro: Cranial nerves II through XII are intact, neurovascularly intact in all extremities with 2+ DTRs and 2+ pulses.  Lymph: No lymphadenopathy of posterior or anterior cervical chain or axillae bilaterally.  Gait normal with good balance and coordination.  MSK:  Non tender with full range of motion and good stability and symmetric strength and tone of shoulders,  wrist, hip, knees bilaterally.  Elbow:right Unremarkable to inspection. Range of motion full pronation, supination, flexion, extension. Strength is full to all of the above directions Stable to varus, valgus stress. Negative moving valgus stress test. Pain is more over the posterior interosseous nerve as well as somewhat over the lateral epicondylar region Ulnar nerve does not sublux. Negative cubital tunnel Tinel's. Contralateral side unremarkable Ankle:left No visible erythema or  swelling. Range of motion is full in all directions. Strength is 5/5 in all directions. Stable lateral and medial ligaments; squeeze test and kleiger test unremarkable; Talar dome nontender; No pain at base of 5th MT; No tenderness over cuboid; No tenderness over N spot or navicular prominence No tenderness on posterior aspects of lateral and medial malleolus No sign of peroneal tendon subluxations or tenderness to palpation Negative tarsal tunnel tinel's Achilles nodule noted with mild tenderness Able to walk 4 steps. Contralateral side unremarkable       Impression and Recommendations:     This case required medical decision making of moderate complexity.

## 2014-10-26 ENCOUNTER — Ambulatory Visit: Payer: BLUE CROSS/BLUE SHIELD | Admitting: Family Medicine

## 2014-11-14 ENCOUNTER — Ambulatory Visit (INDEPENDENT_AMBULATORY_CARE_PROVIDER_SITE_OTHER): Payer: BLUE CROSS/BLUE SHIELD | Admitting: Family Medicine

## 2014-11-14 ENCOUNTER — Encounter: Payer: Self-pay | Admitting: Family Medicine

## 2014-11-14 ENCOUNTER — Other Ambulatory Visit (INDEPENDENT_AMBULATORY_CARE_PROVIDER_SITE_OTHER): Payer: BLUE CROSS/BLUE SHIELD

## 2014-11-14 VITALS — BP 138/82 | HR 79 | Ht 70.0 in | Wt 288.0 lb

## 2014-11-14 DIAGNOSIS — M25521 Pain in right elbow: Secondary | ICD-10-CM

## 2014-11-14 DIAGNOSIS — M67972 Unspecified disorder of synovium and tendon, left ankle and foot: Secondary | ICD-10-CM

## 2014-11-14 NOTE — Progress Notes (Signed)
Pre visit review using our clinic review tool, if applicable. No additional management support is needed unless otherwise documented below in the visit note. 

## 2014-11-14 NOTE — Patient Instructions (Addendum)
Good to see you Do nitro only on elbow now.  Xray downstairs when you get a chance.  Ice is your friend Take 3 days off from my exercises then start again  3 times a week.  If you can wait until say Saturday for golf.  Consider a compression sleeve when playing.  See me again in 3 weeks.

## 2014-11-14 NOTE — Assessment & Plan Note (Signed)
Significantly better at this time. No changes in management except decreasing the nitroglycerin.

## 2014-11-14 NOTE — Assessment & Plan Note (Signed)
Patient right elbow pain seems to be worsening. Patient has limitation of range of motion and did have a small effusion noted on ultrasound today. Patient did have some mild abnormality of the radial head. Did not see any true fracture though noted. X-rays are pending. Patient was having enough pain that he did that choose to have an injection. Patient tolerated the procedure well with good resolution of pain immediately. We discussed limiting her activity for the next 72 hours. We discussed postinjection instructions. Patient will come back again in 3 weeks after trying conservative therapy. If continuing to have difficulty or decreasing range of motion we will have to get that advance imaging to rule out any other intra-articular pathology.

## 2014-11-14 NOTE — Progress Notes (Signed)
[name] [address] [place], [place] [address] Phone: [phone] Subjective:    I'm seeing this patient by the request  of:  Tana Conch, MD   CC: left posterior ankle pain. Right elbow pain  [id number] Henry Garner is a 34 y.o. male coming in with complaint of left ankle pain. Patient states that he has had tenderness over the Achilles tendon for multiple years. Patient was found to have chronic calcific Achilles tendinosis. Patient did not make significant improvement at last follow-up. Patient was put on nitroglycerin patches. Patient was to continue conservative therapy. Patient states that the ankle is back to his baseline. Not having any significant pain. Denies that this is stopping him from any activities.  Patient is also complaining of right elbow pain. Patient was also found to have a lateral epicondylitis and posterior interosseous nerve entrapment. Patient given exercises, topical anti-inflammatory and icing protocol. Patient has been trying the nitroglycerin here as well. Has not notice any significant improvement and if anything it is worsening. States that it seems to be causing him to have more limitation in range of motion. Starting to affect his daily activities. Patient has not been able to exercise secondary to the generalized discomfort  Past Medical History  Diagnosis Date  . Kidney stone     1 cm stone left in kidney.   . Chicken pox   . OSA (obstructive sleep apnea)     borderline, advised weight loss   Past Surgical History  Procedure Laterality Date  . 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 Anterior cruciate ligament repair  2000    basketball in college  . Hernia repair  1980    as child  . Vasectomy  2015   Social History  Substance Use Topics  . Smoking status: Never Smoker   . Smokeless tobacco: Never Used  . Alcohol Use: 0.0 oz/week    0 Standard drinks or  equivalent per week     Comment: 2-3x a month   Allergies  Allergen Reactions  . Amoxicillin Hives   Family History  Problem Relation Age of Onset  . Hearing loss Father   . Alzheimer's disease Paternal Grandfather   . Other Father     EP issues- Vtach  . Hyperthyroidism Mother     s/p removal- not cancerous        Past medical history, social, surgical and family history all reviewed in electronic medical record.   Review of Systems: No headache, visual changes, nausea, vomiting, diarrhea, constipation, dizziness, abdominal pain, skin rash, fevers, chills, night sweats, weight loss, swollen lymph nodes, body aches, joint swelling, muscle aches, chest pain, shortness of breath, mood changes.   Objective Blood pressure 138/82, pulse 79, height  (1.778 m), weight 288 lb (130.636 kg), SpO2 97 %.  General: No apparent distress alert and oriented x3 mood and affect normal, dressed appropriately.  HEENT: Pupils equal, extraocular movements intact  Respiratory: Patient's speak in full sentences and does not appear short of breath  Cardiovascular: No lower extremity edema, non tender, no erythema  Skin: Warm dry intact with no signs of infection or rash on extremities or on axial skeleton.  Abdomen: Soft nontender  Neuro: Cranial nerves II through XII are intact, neurovascularly intact in all extremities with 2+ DTRs and 2+ pulses.  Lymph: No lymphadenopathy of posterior or anterior cervical chain or axillae bilaterally.  Gait normal with good balance  and coordination.  MSK:  Non tender with full range of motion and good stability and symmetric strength and tone of shoulders,  wrist, hip, knees bilaterally.  Elbow:right Unremarkable to inspection. Range of motion is lacking the last 5 of extension. Strength is full to all of the above directions Stable to varus, valgus stress. Negative moving valgus stress test. Pain seems to be generalized to the elbow joint and mostly over  the cubital fossa Ulnar nerve does not sublux. Negative cubital tunnel Tinel's. Contralateral side unremarkable Ankle:left No visible erythema or swelling. Range of motion is full in all directions. Strength is 5/5 in all directions. Stable lateral and medial ligaments; squeeze test and kleiger test unremarkable; Talar dome nontender; No pain at base of 5th MT; No tenderness over cuboid; No tenderness over N spot or navicular prominence No tenderness on posterior aspects of lateral and medial malleolus No sign of peroneal tendon subluxations or tenderness to palpation Negative tarsal tunnel tinel's Achilles nodule noted but nontender which is an improvement Able to walk 4 steps. Contralateral side unremarkable  Musculoskeletal ultrasound was performed and interpreted by Terrilee Files D.O.   Elbow: Right Lateral epicondyle and common extensor tendon origin visualized.  Patient's superficial tendon does have what appears to be a tear with increasing Doppler flow. Approximately 10% of the tendon noted. Radial head  has increasing hypoechoic changes as well as increasing Doppler flow Medial epicondyle and common flexor tendon origin visualized.  No edema, effusions, or avulsions seen. Ulnar nerve in cubital tunnel unremarkable. Olecranon and triceps insertion visualized and unremarkable without edema, effusion, or avulsion.  No signs olecranon bursitis. Patient though does have very small effusion of the joint itself. Power doppler signal normal.  IMPRESSION:  Nonspecific swelling of the right elbow    Procedure: Real-time Ultrasound Guided Injection of right elbow joint Device: GE Logiq E  Ultrasound guided injection is preferred based studies that show increased duration, increased effect, greater accuracy, decreased procedural pain, increased response rate with ultrasound guided versus blind injection.  Verbal informed consent obtained.  Time-out conducted.  Noted no overlying  erythema, induration, or other signs of local infection.  Skin prepped in a sterile fashion.  Local anesthesia: Topical Ethyl chloride.  With sterile technique and under real time ultrasound guidance: 25g 1  inch needle inserted distal to proximal approach into area of i elbow joint. Pictures taken nfor needle placement. Patient did have injection of  1 cc of 0.5% Marcaine, and 1 cc of Kenalog 40 mg/dL. Completed without difficulty  Pain immediately resolved suggesting accurate placement of the medication.  Advised to call if fevers/chills, erythema, induration, drainage, or persistent bleeding.  Images permanently stored and available for review in the ultrasound unit.  Impression: Technically successful ultrasound guided injection.    Impression and Recommendations:     This case required medical decision making of moderate complexity.

## 2014-12-08 ENCOUNTER — Ambulatory Visit: Payer: BLUE CROSS/BLUE SHIELD | Admitting: Family Medicine

## 2014-12-14 ENCOUNTER — Encounter: Payer: Self-pay | Admitting: Family Medicine

## 2014-12-14 ENCOUNTER — Ambulatory Visit (INDEPENDENT_AMBULATORY_CARE_PROVIDER_SITE_OTHER): Payer: BLUE CROSS/BLUE SHIELD | Admitting: Family Medicine

## 2014-12-14 VITALS — BP 140/86 | HR 82 | Ht 70.0 in | Wt 284.0 lb

## 2014-12-14 DIAGNOSIS — M25521 Pain in right elbow: Secondary | ICD-10-CM

## 2014-12-14 NOTE — Progress Notes (Signed)
Pre visit review using our clinic review tool, if applicable. No additional management support is needed unless otherwise documented below in the visit note. 

## 2014-12-14 NOTE — Progress Notes (Signed)
Henry Garner D.O. [facility] [address] [place], [place] [address] Phone: [phone] Subjective:     CC: Right elbow pain  BJY:[id number]:Subjective Henry Garner is a 34 y.o. male coming in with complaint of right elbow pain. Continues to have pain and swelling. Was better after the injection for approximate 4 days. Patient states that now he is having just the same amount of pain. Hurts more on the lateral and medial aspect of the elbow. Feels like it seems to be worsening.  Past Medical History  Diagnosis Date  . Kidney stone     1 cm stone left in kidney.   . Chicken pox   . OSA (obstructive sleep apnea)     borderline, advised weight loss   Past Surgical History  Procedure Laterality Date  . 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 Anterior cruciate ligament repair  2000    basketball in college  . Hernia repair  1980    as child  . Vasectomy  2015   Social History  Substance Use Topics  . Smoking status: Never Smoker   . Smokeless tobacco: Never Used  . Alcohol Use: 0.0 oz/week    0 Standard drinks or equivalent per week     Comment: 2-3x a month   Allergies  Allergen Reactions  . Amoxicillin Hives   Family History  Problem Relation Age of Onset  . Hearing loss Father   . Alzheimer's disease Paternal Grandfather   . Other Father     EP issues- Vtach  . Hyperthyroidism Mother     s/p removal- not cancerous        Past medical history, social, surgical and family history all reviewed in electronic medical record.   Review of Systems: No headache, visual changes, nausea, vomiting, diarrhea, constipation, dizziness, abdominal pain, skin rash, fevers, chills, night sweats, weight loss, swollen lymph nodes, body aches, joint swelling, muscle aches, chest pain, shortness of breath, mood changes.   Objective Blood pressure 140/86, pulse 82, height 5\' 10"  (1.778 m), weight 284 lb (128.822 kg), SpO2 97 %.  General: No  apparent distress alert and oriented x3 mood and affect normal, dressed appropriately.  HEENT: Pupils equal, extraocular movements intact  Respiratory: Patient's speak in full sentences and does not appear short of breath  Cardiovascular: No lower extremity edema, non tender, no erythema  Skin: Warm dry intact with no signs of infection or rash on extremities or on axial skeleton.  Abdomen: Soft nontender  Neuro: Cranial nerves II through XII are intact, neurovascularly intact in all extremities with 2+ DTRs and 2+ pulses.  Lymph: No lymphadenopathy of posterior or anterior cervical chain or axillae bilaterally.  Gait normal with good balance and coordination.  MSK:  Non tender with full range of motion and good stability and symmetric strength and tone of shoulders,  wrist, hip, knees bilaterally.  Elbow:right Mild increase in range of motion from previous exam but patient seems to have more swelling than previous exam. Strength is full to all of the above directions Stable to varus, valgus stress. Negative moving valgus stress test. Pain seems to be generalized to the elbow joint and mostly over the cubital fossa and seems to be worsening. Ulnar nerve does not sublux. Negative cubital tunnel Tinel's. Contralateral side unremarkable          Impression and Recommendations:     This case required medical decision making of moderate complexity.

## 2014-12-14 NOTE — Patient Instructions (Signed)
Good to see you Get xray downstairs today MRI is pending  I will write you with results.  ICe is still yoruf friend Try the medicine 1 times daily for next 7 days.

## 2014-12-14 NOTE — Assessment & Plan Note (Signed)
Patient appears to have more of an intra-articular fracture possibly or pathology. Patient still has some limitation in range of motion especially in extension. Still has some swelling noted as well. No signs of infection. Patient has no history of gout but given a trial of gout medications. Ordered x-rays of the do think that an MRI is going to be necessary. This is affecting his daily activities. Patient cannot even work with this pain. Mild radiation but no numbness. Patient states that there is deathly weakness. Patient will then depending on the results of the MRI we'll discuss further follow-up and management. Has already failed formal physical therapy, injection, topical and oral anti-inflammatories as well as nitroglycerin.  Spent  25 minutes with patient face-to-face and had greater than 50% of counseling including as described above in assessment and plan.

## 2014-12-19 ENCOUNTER — Ambulatory Visit (INDEPENDENT_AMBULATORY_CARE_PROVIDER_SITE_OTHER)
Admission: RE | Admit: 2014-12-19 | Discharge: 2014-12-19 | Disposition: A | Discharge status: Home / Self Care | Payer: BLUE CROSS/BLUE SHIELD | Source: Ambulatory Visit | Attending: Family Medicine | Admitting: Family Medicine

## 2014-12-19 DIAGNOSIS — M25521 Pain in right elbow: Secondary | ICD-10-CM | POA: Diagnosis not present

## 2014-12-24 ENCOUNTER — Ambulatory Visit
Admission: RE | Admit: 2014-12-24 | Discharge: 2014-12-24 | Disposition: A | Discharge status: Home / Self Care | Payer: BLUE CROSS/BLUE SHIELD | Source: Ambulatory Visit | Attending: Family Medicine | Admitting: Family Medicine

## 2014-12-24 DIAGNOSIS — M25521 Pain in right elbow: Secondary | ICD-10-CM

## 2014-12-29 ENCOUNTER — Encounter: Payer: Self-pay | Admitting: Family Medicine

## 2014-12-29 DIAGNOSIS — M7711 Lateral epicondylitis, right elbow: Secondary | ICD-10-CM

## 2014-12-29 NOTE — Telephone Encounter (Signed)
Spoke with pt, entered referral.  

## 2016-05-23 ENCOUNTER — Ambulatory Visit (INDEPENDENT_AMBULATORY_CARE_PROVIDER_SITE_OTHER): Payer: BLUE CROSS/BLUE SHIELD | Admitting: Family Medicine

## 2016-05-23 ENCOUNTER — Encounter: Payer: Self-pay | Admitting: Family Medicine

## 2016-05-23 VITALS — BP 120/84 | HR 68 | Temp 98.8°F | Ht 70.0 in | Wt 289.6 lb

## 2016-05-23 DIAGNOSIS — R739 Hyperglycemia, unspecified: Secondary | ICD-10-CM | POA: Diagnosis not present

## 2016-05-23 DIAGNOSIS — Z23 Encounter for immunization: Secondary | ICD-10-CM

## 2016-05-23 DIAGNOSIS — E785 Hyperlipidemia, unspecified: Secondary | ICD-10-CM | POA: Diagnosis not present

## 2016-05-23 DIAGNOSIS — Z0000 Encounter for general adult medical examination without abnormal findings: Secondary | ICD-10-CM

## 2016-05-23 LAB — POC URINALSYSI DIPSTICK (AUTOMATED)
Bilirubin, UA: NEGATIVE
GLUCOSE UA: NEGATIVE
Ketones, UA: NEGATIVE
Leukocytes, UA: NEGATIVE
Nitrite, UA: NEGATIVE
PROTEIN UA: NEGATIVE
RBC UA: NEGATIVE
SPEC GRAV UA: 1.02 (ref 1.030–1.035)
UROBILINOGEN UA: 0.2 (ref ?–2.0)
pH, UA: 6 (ref 5.0–8.0)

## 2016-05-23 LAB — COMPREHENSIVE METABOLIC PANEL
ALT: 23 U/L (ref 0–53)
AST: 24 U/L (ref 0–37)
Albumin: 4.5 g/dL (ref 3.5–5.2)
Alkaline Phosphatase: 93 U/L (ref 39–117)
BUN: 12 mg/dL (ref 6–23)
CO2: 29 meq/L (ref 19–32)
CREATININE: 0.88 mg/dL (ref 0.40–1.50)
Calcium: 9.4 mg/dL (ref 8.4–10.5)
Chloride: 103 mEq/L (ref 96–112)
GFR: 103.15 mL/min (ref >60.00)
GLUCOSE: 101 mg/dL — AB (ref 70–99)
Potassium: 4.2 mEq/L (ref 3.5–5.1)
Sodium: 139 mEq/L (ref 135–145)
Total Bilirubin: 0.5 mg/dL (ref 0.2–1.2)
Total Protein: 6.9 g/dL (ref 6.0–8.3)

## 2016-05-23 LAB — CBC
HCT: 46.5 % (ref 39.0–52.0)
Hemoglobin: 16.2 g/dL (ref 13.0–17.0)
MCHC: 34.8 g/dL (ref 30.0–36.0)
MCV: 84.6 fl (ref 78.0–100.0)
PLATELETS: 219 10*3/uL (ref 150.0–400.0)
RBC: 5.5 Mil/uL (ref 4.22–5.81)
RDW: 13.8 % (ref 11.5–15.5)
WBC: 9.2 10*3/uL (ref 4.0–10.5)

## 2016-05-23 LAB — LIPID PANEL
CHOLESTEROL: 220 mg/dL — AB (ref 0–200)
HDL: 38.3 mg/dL — ABNORMAL LOW (ref >39.00)
LDL CALC: 152 mg/dL — AB (ref 0–99)
NonHDL: 182.05
Total CHOL/HDL Ratio: 6
Triglycerides: 148 mg/dL (ref 0.0–149.0)
VLDL: 29.6 mg/dL (ref 0.0–40.0)

## 2016-05-23 LAB — HEMOGLOBIN A1C: HEMOGLOBIN A1C: 5.5 % (ref 4.6–6.5)

## 2016-05-23 NOTE — Patient Instructions (Addendum)
Really need weight loss- you had planned to cut out tea and soda and work on portion control. Try to cut down on sweets and unhealthy snacking.   Goal at least 20 lbs off in 6 months.   If you feel like you are not making progress by 3 months- consider nutrition referral or weight watchers online  Tdap today  Please stop by lab before you go   WE NOW OFFER   [facility] FAST TRACK!!!  SAME DAY Appointments for ACUTE CARE  Such as: Sprains, Injuries, cuts, abrasions, rashes, muscle pain, joint pain, back pain Colds, flu, sore throats, headache, allergies, cough, fever  Ear pain, sinus and eye infections Abdominal pain, nausea, vomiting, diarrhea, upset stomach Animal/insect bites  3 Easy Ways to Schedule: Walk-In Scheduling Call in scheduling Mychart Sign-up: [email or web address]

## 2016-05-23 NOTE — Progress Notes (Signed)
Phone: [phone]  Subjective:  Patient presents today for their annual physical. Chief complaint-noted.   See problem oriented charting- ROS- full  review of systems was completed and negative except for: some low back pain at times slightly into right leg. No chest pain or shortness of breath. No headache or blurry vision.   The following were reviewed and entered/updated in epic: Past Medical History:  Diagnosis Date  . Chicken pox   . Kidney stone    1 cm stone left in kidney.   . OSA (obstructive sleep apnea)    borderline, advised weight loss   Patient Active Problem List   Diagnosis Date Noted  . Right elbow pain 11/14/2014  . Achilles tendon disorder 08/28/2014  . Lateral epicondylitis of right elbow 08/28/2014   Past Surgical History:  Procedure Laterality Date  . ANTERIOR CRUCIATE LIGAMENT REPAIR  2000   basketball in college  . CATARACT EXTRACTION  1982   Left eye-congenital. Eye now brown  . HERNIA REPAIR  1980   as child  . VASECTOMY  2015    Family History  Problem Relation Age of Onset  . Hearing loss Father   . Alzheimer's disease Paternal Grandfather   . Other Father     EP issues- Vtach  . Hyperthyroidism Mother     s/p removal- not cancerous    Medications- reviewed and updated Current Outpatient Prescriptions  Medication Sig Dispense Refill  . ibuprofen (ADVIL,MOTRIN) 200 MG tablet Take 800 mg by mouth every 6 (six) hours as needed.    . Diclofenac Sodium 2 % SOLN Apply 1 pump twice daily. (Patient not taking: Reported on 05/23/2016) 112 g 3  . nitroGLYCERIN (NITRODUR - DOSED IN MG/24 HR) 0.2 mg/hr patch 1/4 patch daily (Patient not taking: Reported on 05/23/2016) 30 patch 1   No current facility-administered medications for this visit.     Allergies-reviewed and updated Allergies  Allergen Reactions  . Amoxicillin Hives    Social History   Social History  . Marital status: Married    Spouse name: N/A  . Number of children: N/A  .  Years of education: N/A   Social History Main Topics  . Smoking status: Never Smoker  . Smokeless tobacco: Never Used  . Alcohol use 0.0 oz/week     Comment: 2-3x a month  . Drug use: No  . Sexual activity: Yes   Other Topics Concern  . 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 [employer]uge [employer] Wife went to [employer].       Hobbies: time with family, kids getting interested in golfing, singing at [employer]- season tickets to [place]    Objective: BP 120/84 (BP Location: Left Arm, Patient Position: Sitting, Cuff Size: Large)   Pulse 68   Temp 98.8 F (37.1 C) (Oral)   Ht  (1.778 m)   Wt 289 lb 9.6 oz (131.4 kg)   SpO2 98%   BMI 41.55 kg/m  Gen: NAD, resting comfortably HEENT: Mucous membranes are moist. Oropharynx normal Neck: no thyromegaly CV: RRR no murmurs rubs or gallops Lungs: CTAB no crackles, wheeze, rhonchi Abdomen: soft/nontender/nondistended/normal bowel sounds. Morbid obesity No pain with deep palpation of right low back. Some pain with crossing leg- not worse with FABER Ext: no edema Skin: warm, dry Neuro: grossly normal, moves all extremities, PERRLA  Assessment/Plan:  36 y.o. male presenting for annual physical.  Health Maintenance counseling: 1. Anticipatory guidance: Patient counseled regarding regular dental exams - at least yearly, eye exams - yearly, wearing seatbelts.  2. Risk factor reduction:  Advised patient of need for regular exercise and diet rich and fruits and vegetables to reduce risk of heart attack and stroke.At time of last visit was going to gym 3-4x a week including cardio and free weights and working on diet. He was down to 282 and peak weight had been 288. Current Exercise- still going MWF. Diet- Going to cut out tea and soft drinks Has to work on portion control.  Wt Readings from Last 3 Encounters:  05/23/16 289 lb 9.6  oz (131.4 kg)  12/14/14 284 lb (128.8 kg)  11/14/14 288 lb (130.6 kg)  3. Immunizations/screenings/ancillary studies- advised Tdap today  4. Prostate cancer screening-  no family history, start at age 51 5. Colon cancer screening -  no family history, start at age 56 6. Skin cancer screening/prevention-  advised regular sunscreen use/already doing.  7. Testicular cancer screening- advised monthly self exams  8. STD screening- patient opts out as monogamous   Status of chronic or acute concerns   Hyperlipidemia- once again discussed importance of weight loss. Will update lipids. Last LDL 144 and triglycerides 161.   Hyperglycemia with cbg of 116 last visit- will get a1c and cbg  History borderline osa- needs weight loss  Right flank pain- doing lemon water at high volume. Monday 8 hour period hurt significantly and has started to improve but has not passed a clear stone. [facility] urologist. Was alternating ibuprofen and tylenol- he will follow up if doesn't improve.   Is going to cut out tea and soft drinkds  Return in about 6 months (around 11/22/2016).  Orders Placed This Encounter  Procedures  . Hemoglobin A1c    [place]  . CBC    [place]  . Comprehensive metabolic panel    [place]    Order Specific Question:   Has the patient fasted?    Answer:   No  . Lipid panel        Order Specific Question:   Has the patient fasted?    Answer:   No  . POCT Urinalysis Dipstick (Automated)   Return precautions advised.  Tana Conch, MD

## 2016-08-05 DIAGNOSIS — Z3009 Encounter for other general counseling and advice on contraception: Secondary | ICD-10-CM | POA: Diagnosis not present

## 2016-12-04 ENCOUNTER — Ambulatory Visit (INDEPENDENT_AMBULATORY_CARE_PROVIDER_SITE_OTHER): Payer: BLUE CROSS/BLUE SHIELD | Admitting: Family Medicine

## 2016-12-04 ENCOUNTER — Encounter: Payer: Self-pay | Admitting: Family Medicine

## 2016-12-04 VITALS — BP 124/84 | HR 57 | Temp 98.7°F | Ht 70.0 in | Wt 270.4 lb

## 2016-12-04 DIAGNOSIS — Z6841 Body Mass Index (BMI) 40.0 and over, adult: Secondary | ICD-10-CM | POA: Insufficient documentation

## 2016-12-04 DIAGNOSIS — E669 Obesity, unspecified: Secondary | ICD-10-CM | POA: Diagnosis not present

## 2016-12-04 DIAGNOSIS — E785 Hyperlipidemia, unspecified: Secondary | ICD-10-CM | POA: Diagnosis not present

## 2016-12-04 NOTE — Assessment & Plan Note (Signed)
S:  Patient at 289 last visit- down 19 lbs today! Last a1c not elevated. CBG was 101 down from 116 previously. OSA borderline- weight loss should help- snoring has been slightly better  Gym 3-4x goal- has been tough in season but plans to get back into it. Was to cut out tea and soft drinks and work on portion control.   Wife found cookbook trimealthymomma- cut down on breads, added sugars.   A/P: His goal is 250 by next visit- I love his aggressiveness- I discussed goal 260. Pick up exercise, continue diet changes

## 2016-12-04 NOTE — Assessment & Plan Note (Signed)
S: hopeful improved controlled with nearly 20 lbs weight loss. No myalgias.  Lab Results  Component Value Date   CHOL 220 (H) 05/23/2016   HDL 38.30 (L) 05/23/2016   LDLCALC 152 (H) 05/23/2016   TRIG 148.0 05/23/2016   CHOLHDL 6 05/23/2016   A/P: continue lifestyle changes and follow up 6 months with full lipids at [facility]PE

## 2016-12-04 NOTE — Progress Notes (Signed)
Subjective:  Henry Garner is a 36 y.o. year old very pleasant male patient who presents for/with See problem oriented charting ROS- No chest pain or shortness of breath. No headache or blurry vision.    Past Medical History-  Patient Active Problem List   Diagnosis Date Noted  . Obesity, Class II, BMI 35-39.9 12/04/2016    Priority: Medium  . Hyperlipidemia 12/04/2016  . Right elbow pain 11/14/2014  . Achilles tendon disorder 08/28/2014  . Lateral epicondylitis of right elbow 08/28/2014    Medications- reviewed and updated Current Outpatient Prescriptions  Medication Sig Dispense Refill  . ibuprofen (ADVIL,MOTRIN) 200 MG tablet Take 800 mg by mouth every 6 (six) hours as needed.     No current facility-administered medications for this visit.     Objective: BP 124/84 (BP Location: Left Arm, Patient Position: Sitting, Cuff Size: Large)   Pulse (!) 57   Temp 98.7 F (37.1 C) (Oral)   Ht 5\' 10"  (1.778 m)   Wt 270 lb 6.4 oz (122.7 kg)   SpO2 100%   BMI 38.80 kg/m  Gen: NAD, resting comfortably CV: RRR no murmurs rubs or gallops Lungs: CTAB no crackles, wheeze, rhonchi Abdomen: soft/nontender/nondistended/normal bowel sounds. Obese but visibly improved in total weight Ext: no edema  Assessment/Plan:  Influenza declined  Obesity, Class II, BMI 35-39.9 S:  Patient at 289 last visit- down 19 lbs today! Last a1c not elevated. CBG was 101 down from 116 previously. OSA borderline- weight loss should help- snoring has been slightly better  Gym 3-4x goal- has been tough in season but plans to get back into it. Was to cut out tea and soft drinks and work on portion control.   Wife found cookbook trimealthymomma- cut down on breads, added sugars.   A/P: His goal is 250 by next visit- I love his aggressiveness- I discussed goal 260. Pick up exercise, continue diet changes  Hyperlipidemia S: hopeful improved controlled with nearly 20 lbs weight loss. No myalgias.  Lab  Results  Component Value Date   CHOL 220 (H) 05/23/2016   HDL 38.30 (L) 05/23/2016   LDLCALC 152 (H) 05/23/2016   TRIG 148.0 05/23/2016   CHOLHDL 6 05/23/2016   A/P: continue lifestyle changes and follow up 6 months with full lipids at CPE   Future Appointments Date Time Provider Department Center  06/11/2017 8:30 AM Shelva MajesticHunter, Stephen O, MD [facility] None   Return precautions advised.  Tana ConchStephen Hunter, MD

## 2016-12-04 NOTE — Patient Instructions (Signed)
Continue your current changes- you have made significant progress  Hoping labs show off your progress as much as the scales.

## 2017-01-29 IMAGING — CT CT RENAL STONE PROTOCOL
2 of 4 series · 16 of 46 positions shown, 18 images · non-contrast
Comparison: None.

CLINICAL DATA: Acute onset right flank pain for 1 day.  Hematuria.

EXAM:
CT ABDOMEN AND PELVIS WITHOUT CONTRAST
TECHNIQUE: Multidetector CT imaging of the abdomen and pelvis was performed
following the standard protocol without oral or intravenous contrast
material administration.

[Series 2: renal stone > 200 lbs 5.0 b31f · axial · 0.76mm/px · z∈[-485,-15]mm · 13 of 104 slices shown, 15 images]
[im 5/104  soft-tissue]
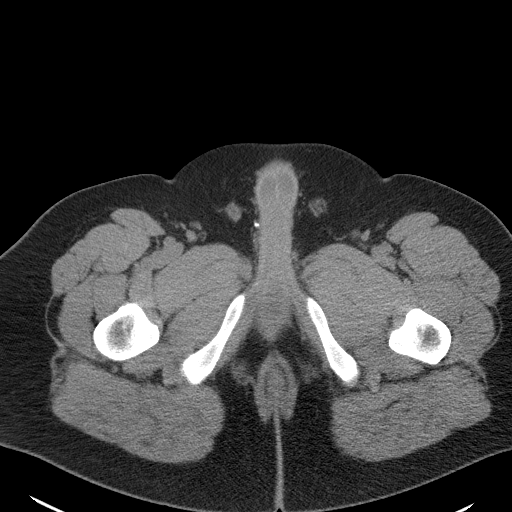
[im 5/104  bone]
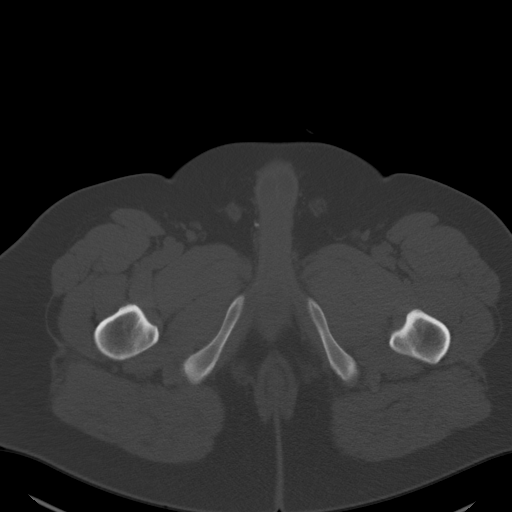
[im 14/104  soft-tissue]
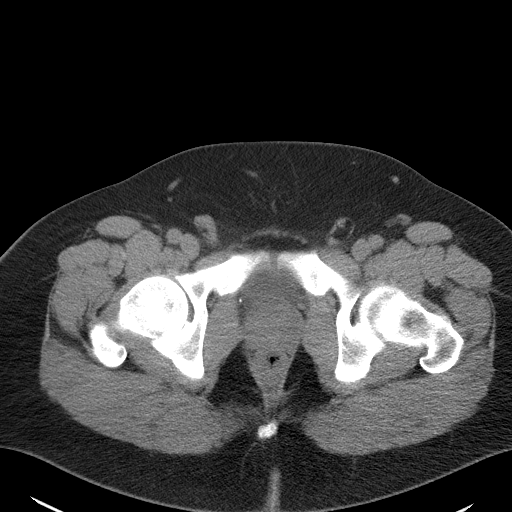
[im 23/104  soft-tissue]
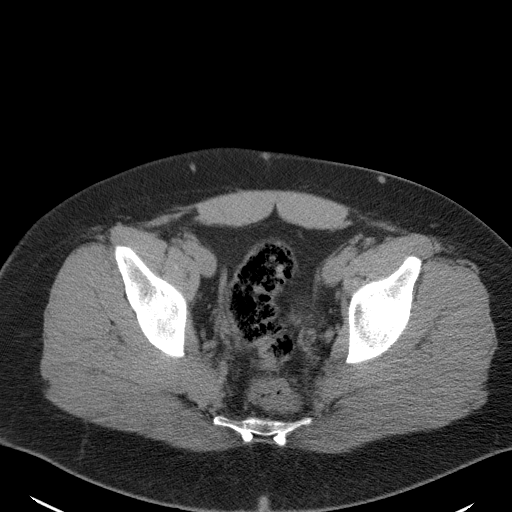
[im 27/104  soft-tissue]
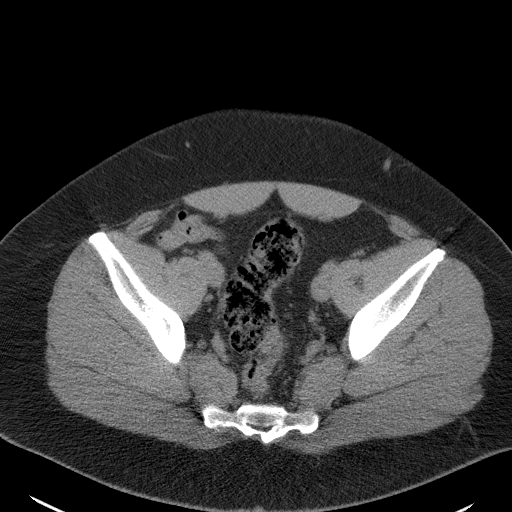
[im 36/104  soft-tissue]
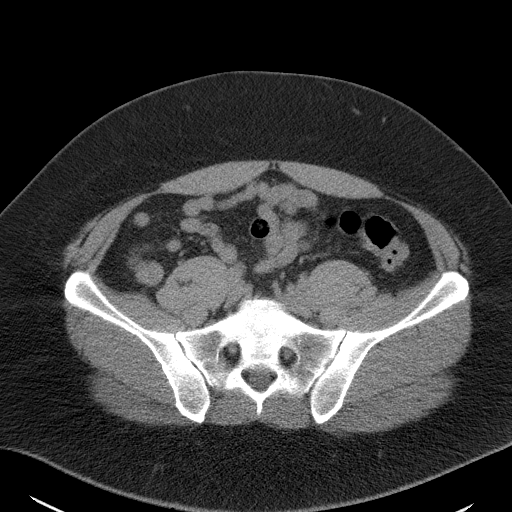
[im 45/104  soft-tissue]
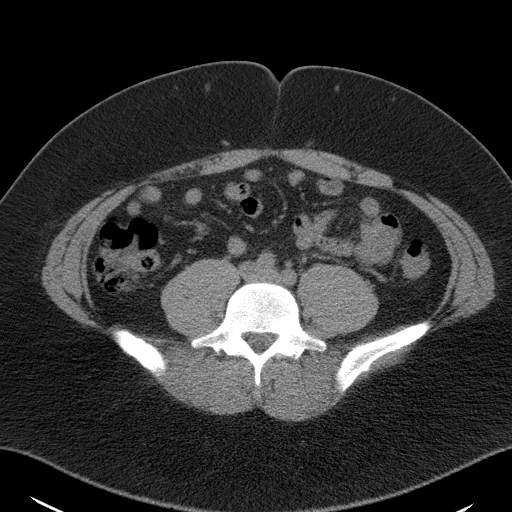
[im 54/104  soft-tissue]
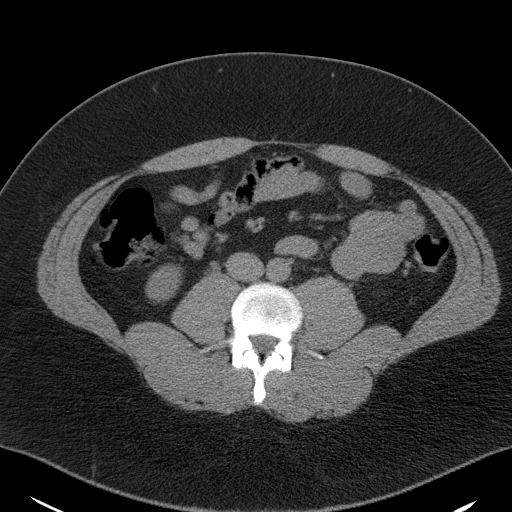
[im 59/104  soft-tissue]
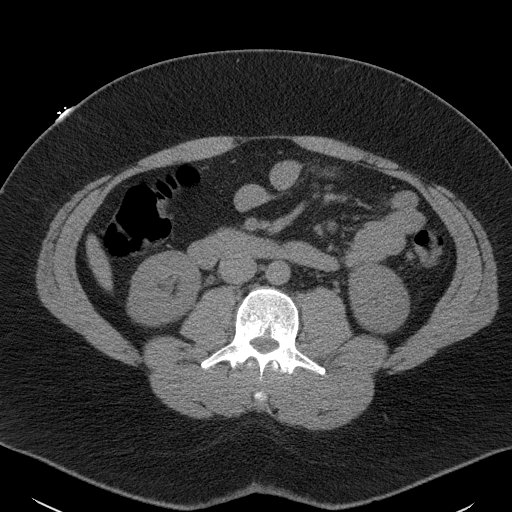
[im 68/104  soft-tissue]
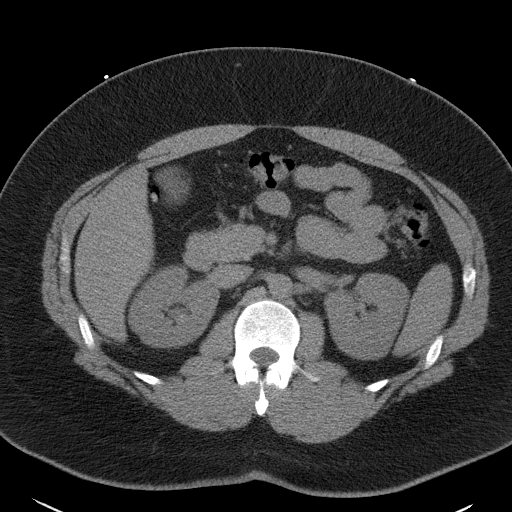
[im 68/104  bone]
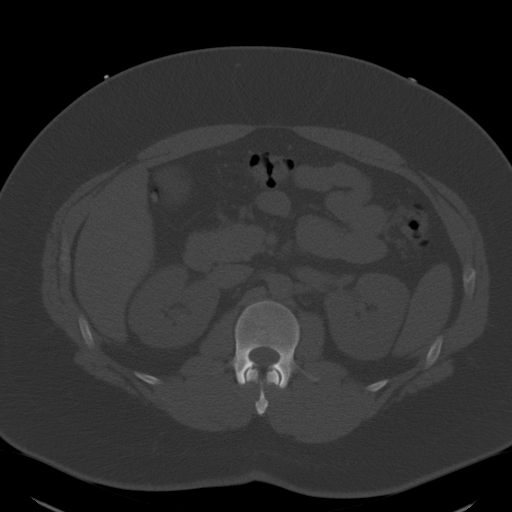
[im 77/104  soft-tissue]
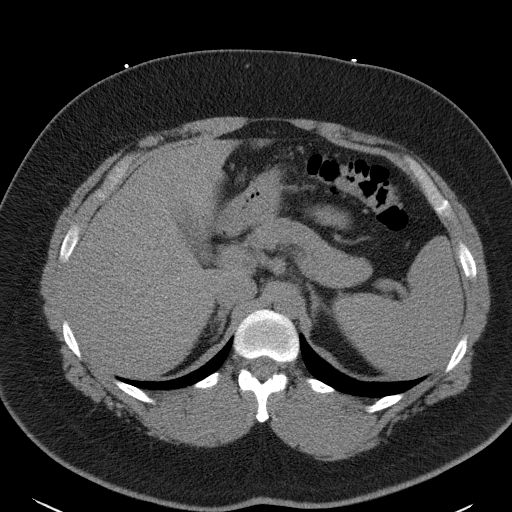
[im 81/104  soft-tissue]
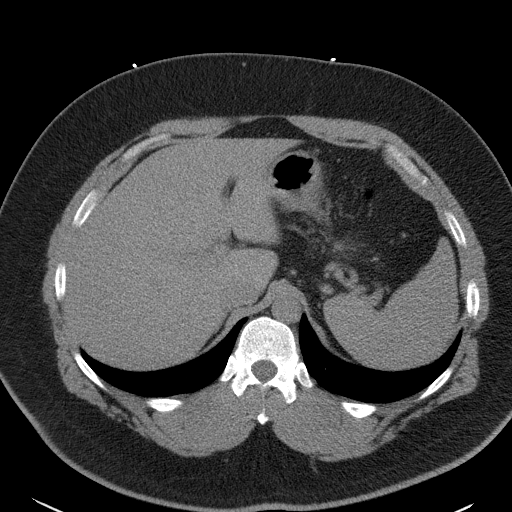
[im 90/104  soft-tissue]
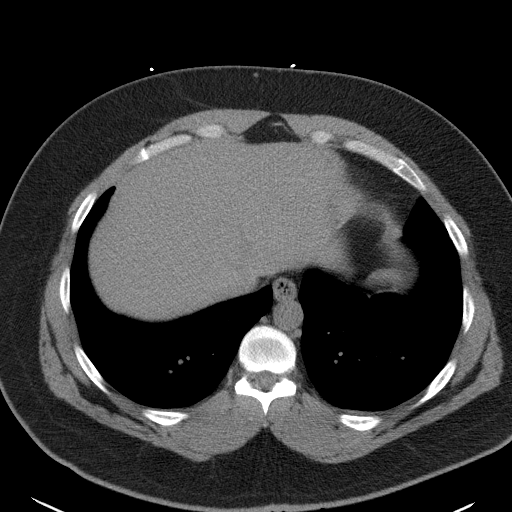
[im 99/104  soft-tissue]
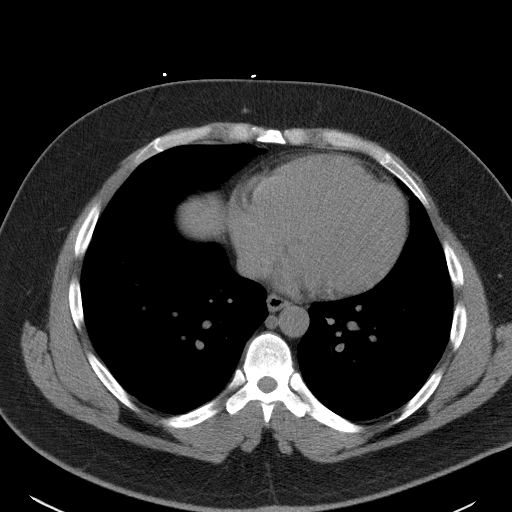

[Series 5: renal stone 3.0 coronal · coronal · 1.01mm/px · 3 of 102 slices shown]
[im 34/102  soft-tissue]
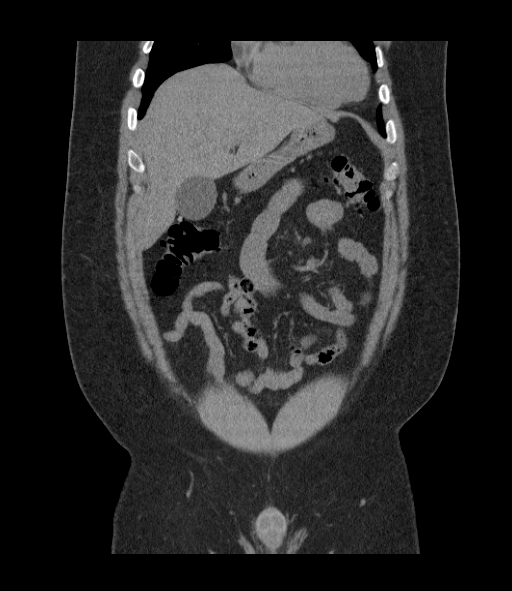
[im 45/102  soft-tissue]
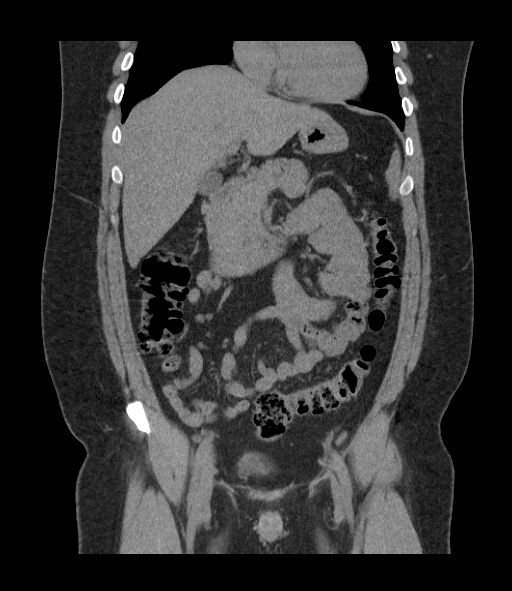
[im 57/102  soft-tissue]
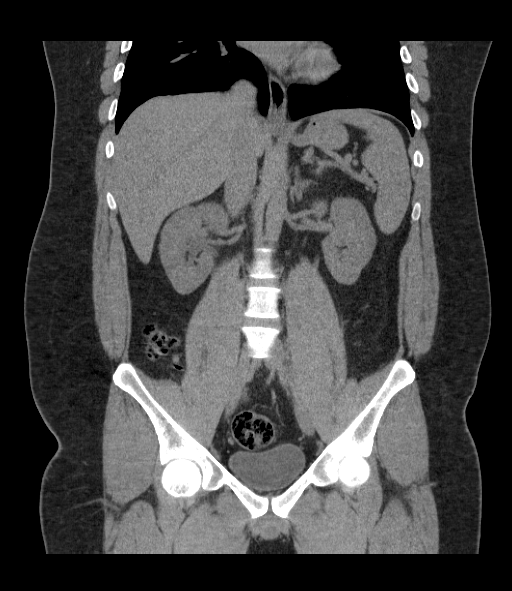

[16 of 46 positions shown; findings below may reference images not displayed]

FINDINGS: There is no edema or consolidation in the lung bases.

No focal liver lesions are identified on this noncontrast enhanced
study. Gallbladder wall is not thickened. There is no biliary duct
dilatation.

Spleen, pancreas, and adrenals appear normal.

Left kidney shows no evidence of mass or hydronephrosis. There is no
left-sided renal or ureteral calculus. On the right, there is mild
hydronephrosis. There is no renal mass. There is a 2 mm
nonobstructing calculus in the upper pole right kidney. There is a 2
mm calculus in the right ureterovesical junction. There is a 6 mm
phleboliths slightly inferior to the right ureter.

In the pelvis, the urinary bladder is midline with normal wall
thickness. There is no pelvic mass or pelvic fluid collection. The
appendix appears normal.

There is no bowel obstruction. No free air or portal venous air.
There is no appreciable ascites, adenopathy, or abscess in the
abdomen or pelvis. There is no abdominal aortic aneurysm. There are
no blastic or lytic bone lesions.
IMPRESSION: 2 mm calculus right ureterovesical junction causing mild
hydronephrosis on the right. There is a nonobstructing 2 mm calculus
upper pole right kidney.

No bowel obstruction.  No abscess.  Appendix appears normal.

## 2017-06-11 ENCOUNTER — Encounter: Payer: Self-pay | Admitting: Family Medicine

## 2017-06-11 ENCOUNTER — Ambulatory Visit (INDEPENDENT_AMBULATORY_CARE_PROVIDER_SITE_OTHER): Payer: BLUE CROSS/BLUE SHIELD | Admitting: Family Medicine

## 2017-06-11 VITALS — BP 122/86 | HR 69 | Temp 98.4°F | Ht 70.0 in | Wt 266.0 lb

## 2017-06-11 DIAGNOSIS — J301 Allergic rhinitis due to pollen: Secondary | ICD-10-CM

## 2017-06-11 DIAGNOSIS — E785 Hyperlipidemia, unspecified: Secondary | ICD-10-CM

## 2017-06-11 DIAGNOSIS — Z0000 Encounter for general adult medical examination without abnormal findings: Secondary | ICD-10-CM | POA: Diagnosis not present

## 2017-06-11 DIAGNOSIS — J309 Allergic rhinitis, unspecified: Secondary | ICD-10-CM | POA: Insufficient documentation

## 2017-06-11 DIAGNOSIS — E669 Obesity, unspecified: Secondary | ICD-10-CM

## 2017-06-11 LAB — CBC
HEMATOCRIT: 48.2 % (ref 39.0–52.0)
HEMOGLOBIN: 16.4 g/dL (ref 13.0–17.0)
MCHC: 34.1 g/dL (ref 30.0–36.0)
MCV: 85.7 fl (ref 78.0–100.0)
Platelets: 223 10*3/uL (ref 150.0–400.0)
RBC: 5.63 Mil/uL (ref 4.22–5.81)
RDW: 13.1 % (ref 11.5–15.5)
WBC: 8.7 10*3/uL (ref 4.0–10.5)

## 2017-06-11 LAB — LIPID PANEL
Cholesterol: 197 mg/dL (ref 0–200)
HDL: 41.8 mg/dL (ref >39.00)
LDL CALC: 141 mg/dL — AB (ref 0–99)
NONHDL: 155.23
Total CHOL/HDL Ratio: 5
Triglycerides: 73 mg/dL (ref 0.0–149.0)
VLDL: 14.6 mg/dL (ref 0.0–40.0)

## 2017-06-11 LAB — COMPREHENSIVE METABOLIC PANEL
ALT: 14 U/L (ref 0–53)
AST: 17 U/L (ref 0–37)
Albumin: 4.4 g/dL (ref 3.5–5.2)
Alkaline Phosphatase: 96 U/L (ref 39–117)
BUN: 14 mg/dL (ref 6–23)
CHLORIDE: 103 meq/L (ref 96–112)
CO2: 30 meq/L (ref 19–32)
Calcium: 9.5 mg/dL (ref 8.4–10.5)
Creatinine, Ser: 0.96 mg/dL (ref 0.40–1.50)
GFR: 92.78 mL/min (ref >60.00)
GLUCOSE: 96 mg/dL (ref 70–99)
POTASSIUM: 4.6 meq/L (ref 3.5–5.1)
SODIUM: 139 meq/L (ref 135–145)
Total Bilirubin: 0.6 mg/dL (ref 0.2–1.2)
Total Protein: 6.8 g/dL (ref 6.0–8.3)

## 2017-06-11 MED ORDER — FLUTICASONE PROPIONATE 50 MCG/ACT NA SUSP: 2.0000 | Freq: Every day | NASAL | 3 refills | Status: DC

## 2017-06-11 NOTE — Assessment & Plan Note (Signed)
Patient wants to do a weight check.  His goal is 250 or less.  He continues to make great progress.  Wt Readings from Last 3 Encounters:  06/11/17 266 lb (120.7 kg)  12/04/16 270 lb 6.4 oz (122.7 kg)  05/23/16 289 lb 9.6 oz (131.4 kg)

## 2017-06-11 NOTE — Assessment & Plan Note (Signed)
Seasonal allergies- claritin doesn't help too much. We will add flonase to see if that helps- start with 2 sprays each nostril for 2 weeks then try 1 spray each nostril perhaps through June then can stop when things calm down

## 2017-06-11 NOTE — Progress Notes (Signed)
Phone: [phone]  Subjective:  Patient presents today for their annual physical. Chief complaint-noted.   See problem oriented charting- ROS- full  review of systems was completed and negative except for: seasonal allergies  The following were reviewed and entered/updated in epic: Past Medical History:  Diagnosis Date  . Chicken pox   . Kidney stone    1 cm stone left in kidney.   . OSA (obstructive sleep apnea)    borderline, advised weight loss   Patient Active Problem List   Diagnosis Date Noted  . Obesity, Class II, BMI 35-39.9 12/04/2016    Priority: Medium  . Hyperlipidemia 12/04/2016  . Right elbow pain 11/14/2014  . Achilles tendon disorder 08/28/2014  . Lateral epicondylitis of right elbow 08/28/2014   Past Surgical History:  Procedure Laterality Date  . ANTERIOR CRUCIATE LIGAMENT REPAIR  2000   basketball in college  . CATARACT EXTRACTION  1982   Left eye-congenital. Eye now brown  . [place]   as child  . VASECTOMY  2015    Family History  Problem Relation Age of Onset  . Hearing loss Father   . Alzheimer's disease Paternal Grandfather   . Other Father        EP issues- Vtach  . Hyperthyroidism Mother        s/p removal- not cancerous    Medications- reviewed and updated Current Outpatient Medications  Medication Sig Dispense Refill  . loratadine (CLARITIN) 10 MG tablet Take 10 mg by mouth daily.    . fluticasone (FLONASE) 50 MCG/ACT nasal spray Place 2 sprays into both nostrils daily. 16 g 3  . ibuprofen (ADVIL,MOTRIN) 200 MG tablet Take 800 mg by mouth every 6 (six) hours as needed.     No current facility-administered medications for this visit.     Allergies-reviewed and updated Allergies  Allergen Reactions  . 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 [employer]uge [employer]. Wife went to [employer].      Hobbies: time with family, kids getting interested in golfing, singing at [employer]- season tickets to [place]    Objective: BP 122/86 (BP Location: Left Arm, Patient Position: Sitting, Cuff Size: Normal)   Pulse 69   Temp 98.4 F (36.9 C) (Oral)   Ht 5' 10"  (1.778 m)   Wt 266 lb (120.7 kg)   SpO2 98%   BMI 38.17 kg/m  Gen: NAD, resting comfortably HEENT: Mucous membranes are moist. Oropharynx normal Neck: no thyromegaly CV: RRR no murmurs rubs or gallops Lungs: CTAB no crackles, wheeze, rhonchi Abdomen: soft/nontender/nondistended/normal bowel sounds. No rebound or guarding. obese But improved  Ext: no edema Skin: warm, dry Neuro: grossly normal, moves all extremities, PERRLA  Assessment/Plan:  37 y.o. male presenting for annual physical.  Health Maintenance counseling: 1. Anticipatory guidance: Patient counseled regarding regular dental exams -q6 months, eye exams - in June scheduled, wearing seatbelts.  2. Risk factor reduction:  Advised patient of need for regular exercise and diet rich and fruits and vegetables to reduce risk of heart attack and stroke.  Patient has lost another 4 pounds over the last 6 months.  Previously lost 19 pounds.  He is making tremendous progress.  He has not quite met his goal of 250 but suspect he can over the next 6 months. Working with personal  trainer 2-3 days a week- 2 days in season. Keeping teas and soft drinks out.  Wt Readings from Last 3 Encounters:  06/11/17 266 lb (120.7 kg)  12/04/16 270 lb 6.4 oz (122.7 kg)  05/23/16 289 lb 9.6 oz (131.4 kg)  3. Immunizations/screenings/ancillary studies-  Immunization History  Administered Date(s) Administered  . Tdap 05/23/2016  4. Prostate cancer screening- no family history, start at age 18-55   5. Colon cancer screening -  no family history, start at age 42-50 6. Skin cancer screening/prevention- advised regular sunscreen use. Denies worrisome, changing, or new skin lesions.    7. Testicular cancer screening- advised monthly self exams  8. STD screening- patient opts monogamous  Status of chronic or acute concerns   Hyperglycemia in the past-we will get CBG only given last A1c was not elevated and he is lost further weight.  Borderline obstructive sleep apnea-he continues his weight loss efforts. Snoring has improved. Daytime sleepiness and fatigue are better   Sees urology prn- no recent stone activity  Future Appointments  Date Time Provider [place]  12/11/2017  8:15 AM Marin Olp, MD [facility]   Return in about 6 months (around 12/11/2017) for follow up- or sooner if needed.  Lab/Order associations: Preventative health care - Plan: CBC, Comprehensive metabolic panel, Lipid panel  Hyperlipidemia, unspecified hyperlipidemia type - Plan: CBC, Comprehensive metabolic panel, Lipid panel  Meds ordered this encounter  Medications  . fluticasone (FLONASE) 50 MCG/ACT nasal spray    Sig: Place 2 sprays into both nostrils daily.    Dispense:  16 g    Refill:  3   Return precautions advised.  Garret Reddish, MD

## 2017-06-11 NOTE — Assessment & Plan Note (Signed)
Hyperlipidemia-suspect this has improved with weight loss. No statin will be recommended prior to age 37 and as long as he is losing weight.

## 2017-06-11 NOTE — Patient Instructions (Addendum)
We will add flonase to see if that helps- start with 2 sprays each nostril for 2 weeks then try 1 spray each nostril perhaps through June then can stop when things calm down  Please stop by lab before you go

## 2017-06-11 NOTE — Progress Notes (Signed)
Your CBC was normal (blood counts, infection fighting cells, platelets). Your CMET was normal (kidney, liver, and electrolytes, blood sugar).  You are no longer at risk for diabetes due to your weight loss.  Congratulations. Your cholesterol has improved with bad cholesterol down 11 points, good cholesterol up 3 points, and total cholesterol now under 200.  These numbers are still not perfect but they are approaching the right goal-continue your current weight loss efforts-they are making a tremendous difference.

## 2017-06-12 ENCOUNTER — Telehealth: Payer: Self-pay

## 2017-06-12 NOTE — Telephone Encounter (Signed)
Called patient and left a message to call office back.  

## 2017-08-05 DIAGNOSIS — H52223 Regular astigmatism, bilateral: Secondary | ICD-10-CM | POA: Diagnosis not present

## 2017-08-05 DIAGNOSIS — H53002 Unspecified amblyopia, left eye: Secondary | ICD-10-CM | POA: Diagnosis not present

## 2017-08-05 DIAGNOSIS — Q120 Congenital cataract: Secondary | ICD-10-CM | POA: Diagnosis not present

## 2017-08-25 ENCOUNTER — Encounter: Payer: Self-pay | Admitting: Family Medicine

## 2017-12-11 ENCOUNTER — Ambulatory Visit: Payer: BLUE CROSS/BLUE SHIELD | Admitting: Family Medicine

## 2018-03-30 DIAGNOSIS — J209 Acute bronchitis, unspecified: Secondary | ICD-10-CM | POA: Diagnosis not present

## 2018-03-30 DIAGNOSIS — J0100 Acute maxillary sinusitis, unspecified: Secondary | ICD-10-CM | POA: Diagnosis not present

## 2020-03-19 ENCOUNTER — Encounter: Payer: Self-pay | Admitting: Family Medicine

## 2020-03-19 ENCOUNTER — Ambulatory Visit (INDEPENDENT_AMBULATORY_CARE_PROVIDER_SITE_OTHER): Payer: BC Managed Care – PPO | Admitting: Family Medicine

## 2020-03-19 ENCOUNTER — Other Ambulatory Visit: Payer: Self-pay

## 2020-03-19 VITALS — BP 128/88 | HR 66 | Temp 98.0°F | Ht 70.0 in | Wt 290.8 lb

## 2020-03-19 DIAGNOSIS — Z0000 Encounter for general adult medical examination without abnormal findings: Secondary | ICD-10-CM | POA: Diagnosis not present

## 2020-03-19 DIAGNOSIS — Z1159 Encounter for screening for other viral diseases: Secondary | ICD-10-CM

## 2020-03-19 DIAGNOSIS — E785 Hyperlipidemia, unspecified: Secondary | ICD-10-CM | POA: Diagnosis not present

## 2020-03-19 DIAGNOSIS — R739 Hyperglycemia, unspecified: Secondary | ICD-10-CM
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
## 2020-03-19 NOTE — Patient Instructions (Addendum)
Please stop by lab before you go If you have mychart- we will send your results within 3 business days of [place] receiving them.  If you do not have mychart- we will call you about results within 5 business days of [place] receiving them.  *please also note that you will see labs on mychart as soon as they post. I will later go in and write notes on them- will say "notes from Dr. Durene Cal"  Health Maintenance Due  Topic Date Due  . Hepatitis C Screening will be  done today  Never done  . COVID-19 Vaccine (1) Will give [place] dates at the end of his appointment.  Never done   - he is going to try 2.5mg  of melatonin for the next week and a sleep mask- if this isnt effective he will reach out- would likely try ambien 5 mg. Would try perhaps 3 days in a row and then as needed to try to reset sleep patterns.   myfitnesspal is a reasonable choice for calorie counting  Goals: 1. Continue to exercise 5 days a week 2. Make sustainable changes in your food choices. Figure out healthy foods you enjoy that you can substitute more regularly.  3. Consider reading/listening to info on atomic habits Recommended follow up: Return in about 2 months (around 05/17/2020) for follow up- or sooner if needed.

## 2020-03-20 LAB — CBC WITH DIFFERENTIAL/PLATELET
Basophils Absolute: 0.1 10*3/uL (ref 0.0–0.1)
Basophils Relative: 1.1 % (ref 0.0–3.0)
Eosinophils Absolute: 0.2 10*3/uL (ref 0.0–0.7)
Eosinophils Relative: 2 % (ref 0.0–5.0)
HCT: 48 % (ref 39.0–52.0)
Hemoglobin: 16.2 g/dL (ref 13.0–17.0)
Lymphocytes Relative: 43.3 % (ref 12.0–46.0)
Lymphs Abs: 4.2 10*3/uL — ABNORMAL HIGH (ref 0.7–4.0)
MCHC: 33.7 g/dL (ref 30.0–36.0)
MCV: 85.9 fl (ref 78.0–100.0)
Monocytes Absolute: 1 10*3/uL (ref 0.1–1.0)
Monocytes Relative: 9.8 % (ref 3.0–12.0)
Neutro Abs: 4.3 10*3/uL (ref 1.4–7.7)
Neutrophils Relative %: 43.8 % (ref 43.0–77.0)
Platelets: 223 10*3/uL (ref 150.0–400.0)
RBC: 5.58 Mil/uL (ref 4.22–5.81)
RDW: 13.7 % (ref 11.5–15.5)
WBC: 9.7 10*3/uL (ref 4.0–10.5)

## 2020-03-20 LAB — COMPREHENSIVE METABOLIC PANEL
ALT: 32 U/L (ref 0–53)
AST: 26 U/L (ref 0–37)
Albumin: 4.5 g/dL (ref 3.5–5.2)
Alkaline Phosphatase: 80 U/L (ref 39–117)
BUN: 12 mg/dL (ref 6–23)
CO2: 30 mEq/L (ref 19–32)
Calcium: 10 mg/dL (ref 8.4–10.5)
Chloride: 104 mEq/L (ref 96–112)
Creatinine, Ser: 0.84 mg/dL (ref 0.40–1.50)
GFR: 108.27 mL/min (ref >60.00)
Glucose, Bld: 74 mg/dL (ref 70–99)
Potassium: 4.3 mEq/L (ref 3.5–5.1)
Sodium: 139 mEq/L (ref 135–145)
Total Bilirubin: 0.5 mg/dL (ref 0.2–1.2)
Total Protein: 7.5 g/dL (ref 6.0–8.3)

## 2020-03-20 LAB — LIPID PANEL
Cholesterol: 231 mg/dL — ABNORMAL HIGH (ref 0–200)
HDL: 50.6 mg/dL (ref >39.00)
LDL Cholesterol: 152 mg/dL — ABNORMAL HIGH (ref 0–99)
NonHDL: 180.37
Total CHOL/HDL Ratio: 5
Triglycerides: 140 mg/dL (ref 0.0–149.0)
VLDL: 28 mg/dL (ref 0.0–40.0)

## 2020-03-20 LAB — HEPATITIS C ANTIBODY
Hepatitis C Ab: NONREACTIVE
SIGNAL TO CUT-OFF: 0.38 (ref <1.00)

## 2020-03-20 LAB — HEMOGLOBIN A1C: Hgb A1c MFr Bld: 5.5 % (ref 4.6–6.5)

## 2020-06-18 ENCOUNTER — Ambulatory Visit: Payer: BC Managed Care – PPO | Admitting: Family Medicine

## 2020-06-19 ENCOUNTER — Ambulatory Visit: Payer: BC Managed Care – PPO | Admitting: Family Medicine

## 2021-10-17 ENCOUNTER — Ambulatory Visit: Payer: Self-pay | Admitting: Family Medicine

## 2021-11-11 ENCOUNTER — Encounter: Payer: Self-pay | Admitting: *Deleted

## 2022-01-30 ENCOUNTER — Encounter: Payer: Self-pay | Admitting: *Deleted

## 2022-03-17 ENCOUNTER — Encounter: Payer: Self-pay | Admitting: Family Medicine

## 2022-04-21 ENCOUNTER — Encounter: Payer: Self-pay | Admitting: Family Medicine

## 2022-04-21 ENCOUNTER — Ambulatory Visit (INDEPENDENT_AMBULATORY_CARE_PROVIDER_SITE_OTHER): Payer: No Typology Code available for payment source | Admitting: Family Medicine

## 2022-04-21 VITALS — BP 110/74 | HR 60 | Temp 97.0°F | Ht 70.0 in | Wt 273.0 lb

## 2022-04-21 DIAGNOSIS — R0683 Snoring: Secondary | ICD-10-CM

## 2022-04-21 DIAGNOSIS — Z0000 Encounter for general adult medical examination without abnormal findings: Secondary | ICD-10-CM | POA: Diagnosis not present

## 2022-04-21 DIAGNOSIS — E785 Hyperlipidemia, unspecified: Secondary | ICD-10-CM | POA: Diagnosis not present

## 2022-04-21 DIAGNOSIS — M25531 Pain in right wrist: Secondary | ICD-10-CM

## 2022-04-21 DIAGNOSIS — R739 Hyperglycemia, unspecified: Secondary | ICD-10-CM | POA: Diagnosis not present

## 2022-04-21 LAB — CBC WITH DIFFERENTIAL/PLATELET
Basophils Absolute: 0 10*3/uL (ref 0.0–0.1)
Basophils Relative: 0.7 % (ref 0.0–3.0)
Eosinophils Absolute: 0.1 10*3/uL (ref 0.0–0.7)
Eosinophils Relative: 2.1 % (ref 0.0–5.0)
HCT: 47.9 % (ref 39.0–52.0)
Hemoglobin: 16.3 g/dL (ref 13.0–17.0)
Lymphocytes Relative: 28.6 % (ref 12.0–46.0)
Lymphs Abs: 2 10*3/uL (ref 0.7–4.0)
MCHC: 34 g/dL (ref 30.0–36.0)
MCV: 86.9 fl (ref 78.0–100.0)
Monocytes Absolute: 1 10*3/uL (ref 0.1–1.0)
Monocytes Relative: 14.7 % — ABNORMAL HIGH (ref 3.0–12.0)
Neutro Abs: 3.7 10*3/uL (ref 1.4–7.7)
Neutrophils Relative %: 53.9 % (ref 43.0–77.0)
Platelets: 213 10*3/uL (ref 150.0–400.0)
RBC: 5.51 Mil/uL (ref 4.22–5.81)
RDW: 14 % (ref 11.5–15.5)
WBC: 6.9 10*3/uL (ref 4.0–10.5)

## 2022-04-21 LAB — COMPREHENSIVE METABOLIC PANEL
ALT: 16 U/L (ref 0–53)
AST: 20 U/L (ref 0–37)
Albumin: 4.2 g/dL (ref 3.5–5.2)
Alkaline Phosphatase: 87 U/L (ref 39–117)
BUN: 15 mg/dL (ref 6–23)
CO2: 29 mEq/L (ref 19–32)
Calcium: 9.5 mg/dL (ref 8.4–10.5)
Chloride: 104 mEq/L (ref 96–112)
Creatinine, Ser: 0.91 mg/dL (ref 0.40–1.50)
GFR: 103.12 mL/min (ref >60.00)
Glucose, Bld: 93 mg/dL (ref 70–99)
Potassium: 4.4 mEq/L (ref 3.5–5.1)
Sodium: 140 mEq/L (ref 135–145)
Total Bilirubin: 0.5 mg/dL (ref 0.2–1.2)
Total Protein: 6.7 g/dL (ref 6.0–8.3)

## 2022-04-21 LAB — LIPID PANEL
Cholesterol: 200 mg/dL (ref 0–200)
HDL: 40.9 mg/dL (ref >39.00)
LDL Cholesterol: 143 mg/dL — ABNORMAL HIGH (ref 0–99)
NonHDL: 158.81
Total CHOL/HDL Ratio: 5
Triglycerides: 77 mg/dL (ref 0.0–149.0)
VLDL: 15.4 mg/dL (ref 0.0–40.0)

## 2022-04-21 MED ORDER — FLUTICASONE PROPIONATE 50 MCG/ACT NA SUSP: 2.0000 | Freq: Every day | NASAL | 3 refills | Status: DC

## 2022-04-21 NOTE — Patient Instructions (Addendum)
We will either call you (or see alternate below) within two weeks about your referral to sports medicine (right wrist) and pulmonary (sleep eval)  . Our referral specialist will sometimes also send you a mychart link once referral is approved and then you will call the # listed on there (let us know if you do not see this within 2 weeks or have not received call)  #Seasonal allergies- just started in last few days- plans to start flonase (sent in today). Can layer in zyrtec/claritin/allegra if needed  Great job on weight loss and staying in the gym- keep up the great work!   Please stop by lab before you go If you have mychart- we will send your results within 3 business days of [place] receiving them.  If you do not have mychart- we will call you about results within 5 business days of [place] receiving them.  *please also note that you will see labs on mychart as soon as they post. I will later go in and write notes on them- will say "notes from Dr. Yong Channel"   Recommended follow up: Return in about 1 year (around 04/21/2023) for physical or sooner if needed.Schedule b4 you leave.
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
## 2022-04-23 ENCOUNTER — Ambulatory Visit (INDEPENDENT_AMBULATORY_CARE_PROVIDER_SITE_OTHER): Payer: No Typology Code available for payment source

## 2022-04-23 ENCOUNTER — Ambulatory Visit: Payer: Self-pay

## 2022-04-23 ENCOUNTER — Encounter: Payer: Self-pay | Admitting: Family Medicine

## 2022-04-23 ENCOUNTER — Ambulatory Visit (INDEPENDENT_AMBULATORY_CARE_PROVIDER_SITE_OTHER): Payer: No Typology Code available for payment source | Admitting: Family Medicine

## 2022-04-23 VITALS — BP 122/84 | HR 71 | Ht 70.0 in | Wt 276.8 lb

## 2022-04-23 DIAGNOSIS — M25531 Pain in right wrist: Secondary | ICD-10-CM | POA: Diagnosis not present

## 2022-04-23 NOTE — Patient Instructions (Signed)
Thank you for coming in today.   Please get an Xray today before you leave   I've referred you to Occupational Therapy.  Let us know if you don't hear from them in one week.   Recheck in 6 weeks.   Let me know sooner if this is not working.   Please use Voltaren gel (Generic Diclofenac Gel) up to 4x daily for pain as needed.  This is available over-the-counter as both the name brand Voltaren gel and the generic diclofenac gel.

## 2022-04-23 NOTE — Progress Notes (Signed)
   I, Peterson Lombard, LAT, ATC acting as a scribe for Lynne Leader, MD.  Subjective:    CC: R wrist pain  HPI: Pt is a 42 y/o male c/o R wrist pain x the past few weeks. Pt locates pain to dorsal aspect of the wrist. Denies injury. Worse with pushing off, grabbing, and turning stirring wheel. 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  Minimal pain with golfing. Hx of right elbow surgery.   Grip strength: no Paresthesia: no Aggravates: pushing, driving, grabbing Treatments tried: heat, ice   Pertinent review of Systems: No fevers or chills  Relevant historical information: History of right elbow lateral epicondylitis.   Objective:    Vitals:   04/23/22 1513  BP: 122/84  Pulse: 71  SpO2: 98%   General: Well Developed, well nourished, and in no acute distress.   MSK: Right wrist: Mild swelling. Normal-appearing otherwise. Tender palpation dorsal wrist. Wrist motion some limited extension normal flexion. Intact strength.  Nontender otherwise.  Lab and Radiology Results  Diagnostic Limited MSK Ultrasound of: Right wrist Dorsal wrist shows mild joint effusion.   No significant tenosynovitis visible dorsal wrist compartments. Bony structures otherwise normal-appearing Impression: Mild joint effusion.  X-ray images right wrist obtained today personally and independently interpreted Abnormal appearance of lunate at scapholunate joint.  Otherwise wrist is normal-appearing to x-ray.  No acute fractures. Await formal radiology review  Impression and Recommendations:    Assessment and Plan: 42 y.o. male with right dorsal wrist pain and swelling.  Etiology is somewhat unclear.  Patient has a slightly unusual appearing lunate at scapholunate joint.  Rate radiology over read of this.  For now plan for trial of occupational therapy.  May need MRI arthrogram if not better or sooner based on radiology over read.  PDMP not reviewed this encounter. Orders Placed This Encounter  Procedures    [place] LIMITED JOINT SPACE STRUCTURES UP RIGHT(NO LINKED CHARGES)    Order Specific Question:   Reason for Exam (SYMPTOM  OR DIAGNOSIS REQUIRED)    Answer:   right wrist pain    Order Specific Question:   Preferred imaging location?    Answer:   [facility]   DG Wrist Complete Right    Standing Status:   Future    Number of Occurrences:   1    Standing Expiration Date:   04/23/2023    Order Specific Question:   Reason for Exam (SYMPTOM  OR DIAGNOSIS REQUIRED)    Answer:   eval wrist pain    Order Specific Question:   Preferred imaging location?    Answer:   [facility]   Ambulatory referral to Occupational Therapy    Referral Priority:   Routine    Referral Type:   Occupational Therapy    Referral Reason:   Specialty Services Required    Requested Specialty:   Occupational Therapy    Number of Visits Requested:   1   No orders of the defined types were placed in this encounter.   Discussed warning signs or symptoms. Please see discharge instructions. Patient expresses understanding.   .[email or web address]

## 2022-04-28 NOTE — Progress Notes (Signed)
Right wrist x-ray looks normal to radiology

## 2022-05-12 ENCOUNTER — Ambulatory Visit (INDEPENDENT_AMBULATORY_CARE_PROVIDER_SITE_OTHER): Payer: No Typology Code available for payment source | Admitting: Nurse Practitioner

## 2022-05-12 ENCOUNTER — Encounter: Payer: Self-pay | Admitting: Nurse Practitioner

## 2022-05-12 VITALS — BP 118/66 | HR 75 | Temp 98.4°F | Ht 70.0 in | Wt 277.4 lb

## 2022-05-12 DIAGNOSIS — E669 Obesity, unspecified: Secondary | ICD-10-CM | POA: Diagnosis not present

## 2022-05-12 DIAGNOSIS — G4719 Other hypersomnia: Secondary | ICD-10-CM | POA: Diagnosis not present

## 2022-05-12 DIAGNOSIS — R0683 Snoring: Secondary | ICD-10-CM | POA: Diagnosis not present

## 2022-05-12 NOTE — Progress Notes (Signed)
@Patient  ID: Henry Garner, male    DOB: November 01, 1980, 42 y.o.   MRN: [id number]  No chief complaint on file.   Referring provider: Marin Olp, MD  HPI: 42 year old male, never smoker referred for sleep consult. Past medical history significant for allergic rhinitis, obesity, HLD.  TEST/EVENTS:   05/12/2022: Today-sleep consult Patient presents today for sleep consult.  He has had trouble with his sleep for many years.  He has been told that he snores and stops breathing at night.  He feels tired during the day.  Feels like his sleep is not very restful.  He has trouble staying asleep at night as well.  He does have some dry mouth when he wakes up in the morning.  Occasionally will feel little drowsy when he drives, mainly with longer trips after work.  He denies ever falling asleep while driving. He denies any sleep parasomnias/paralysis, morning headaches. No history of narcolepsy or symptoms of cataplexy. He goes to bed between 10 to 11 PM.  Falls asleep quickly, usually within 15 to 20 minutes.  Wakes 3-4 times a night.  Officially gets up around 6:30 AM.  He has used melatonin in the past but does not seem to help keep him asleep.  No current sleep aids.  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  He is down 30 pounds over the last 2 years.  He did have a sleep study over 10 years ago but was never started on any sort of CPAP therapy.  He does not recall the exact results of this. No history of hypertension, diabetes or stroke.  No significant family history. He is a never smoker.  Drinks alcohol once a month.  He has a couple diet soft drinks every day, which contain caffeine.  He is widowed.  Lives at home with his mother-in-law, daughter and son.  He works at [employer] course in [place].   Epworth 13   Allergies  Allergen Reactions   Amoxicillin Hives    Immunization History  Administered Date(s) Administered   PFIZER(Purple Top)SARS-COV-2 Vaccination  05/06/2019, 06/03/2019, 01/30/2020   Tdap 05/23/2016    Past Medical History:  Diagnosis Date   Chicken pox    Kidney stone    1 cm stone left in kidney.    OSA (obstructive sleep apnea)    borderline, advised weight loss    Tobacco History: Social History   Tobacco Use  Smoking Status Never  Smokeless Tobacco Never   Counseling given: Not Answered   Outpatient Medications Prior to Visit  Medication Sig Dispense Refill   fluticasone (FLONASE) 50 MCG/ACT nasal spray Place 2 sprays into both nostrils daily. 48 g 3   ibuprofen (ADVIL,MOTRIN) 200 MG tablet Take 800 mg by mouth every 6 (six) hours as needed.     loratadine (CLARITIN) 10 MG tablet Take 10 mg by mouth daily.     Melatonin 5 MG CAPS Take by mouth.     No facility-administered medications prior to visit.     Review of Systems:   Constitutional: No night sweats, fevers, chills,  or lassitude. +daytime fatigue, intentional weight loss HEENT: No headaches, difficulty swallowing, tooth/dental problems, or sore throat. No sneezing, itching, ear ache, nasal congestion, or post nasal drip. +AM dry mouth CV:  No chest pain, orthopnea, PND, swelling in lower extremities, anasarca, dizziness, palpitations, syncope Resp: +snoring, witnessed nocturnal apneas. No shortness of breath with exertion or at rest. No excess mucus or  change in color of mucus. No productive or non-productive. No hemoptysis. No wheezing.  No chest wall deformity GI:  No heartburn, indigestion, abdominal pain, nausea, vomiting, diarrhea, change in bowel habits, loss of appetite, bloody stools.  GU: No dysuria, change in color of urine, urgency or frequency.   Skin: No rash, lesions, ulcerations MSK:  No joint pain or swelling.   Neuro: No dizziness or lightheadedness.  Psych: No depression or anxiety. Mood stable. +sleep disturbance    Physical Exam:  BP 118/66 (BP Location: Right Wrist, Patient Position: Sitting, Cuff Size: Normal)   Pulse 75    Temp 98.4 F (36.9 C) (Oral)   Ht 5\' 10"  (1.778 m)   Wt 277 lb 6.4 oz (125.8 kg)   SpO2 100%   BMI 39.80 kg/m   GEN: Pleasant, interactive, well-appearing; obese; in no acute distress. HEENT:  Normocephalic and atraumatic. PERRLA. Sclera white. Nasal turbinates pink, moist and patent bilaterally. No rhinorrhea present. Oropharynx pink and moist, without exudate or edema. No lesions, ulcerations, or postnasal drip. Mallampati III NECK:  Supple w/ fair ROM. No JVD present. Normal carotid impulses w/o bruits. Thyroid symmetrical with no goiter or nodules palpated. No lymphadenopathy.   CV: RRR, no m/r/g, no peripheral edema. Pulses intact, +2 bilaterally. No cyanosis, pallor or clubbing. PULMONARY:  Unlabored, regular breathing. Clear bilaterally A&P w/o wheezes/rales/rhonchi. No accessory muscle use.  GI: BS present and normoactive. Soft, non-tender to palpation. No organomegaly or masses detected. MSK: No erythema, warmth or tenderness. Cap refil <2 sec all extrem. No deformities or joint swelling noted.  Neuro: A/Ox3. No focal deficits noted.   Skin: Warm, no lesions or rashe Psych: Normal affect and behavior. Judgement and thought content appropriate.     Lab Results:  CBC    Component Value Date/Time   WBC 6.9 04/21/2022 0953   RBC 5.51 04/21/2022 0953   HGB 16.3 04/21/2022 0953   HCT 47.9 04/21/2022 0953   PLT 213.0 04/21/2022 0953   MCV 86.9 04/21/2022 0953   MCH 29.6 06/18/2014 0745   MCHC 34.0 04/21/2022 0953   RDW 14.0 04/21/2022 0953   LYMPHSABS 2.0 04/21/2022 0953   MONOABS 1.0 04/21/2022 0953   EOSABS 0.1 04/21/2022 0953   BASOSABS 0.0 04/21/2022 0953    BMET    Component Value Date/Time   NA 140 04/21/2022 0953   K 4.4 04/21/2022 0953   CL 104 04/21/2022 0953   CO2 29 04/21/2022 0953   GLUCOSE 93 04/21/2022 0953   BUN 15 04/21/2022 0953   CREATININE 0.91 04/21/2022 0953   CALCIUM 9.5 04/21/2022 0953   GFRNONAA >60 06/18/2014 0745   GFRAA >60 06/18/2014  0745    BNP No results found for: "BNP"   Imaging:  DG Wrist Complete Right  Result Date: 04/26/2022 CLINICAL DATA:  Right wrist pain.  Golfer. EXAM: RIGHT WRIST - COMPLETE 3+ VIEW COMPARISON:  None Available. FINDINGS: No fracture or dislocation. Joint spaces are preserved given obliquity. No erosions. No evidence of chondrocalcinosis. Regional soft tissues appear normal. IMPRESSION: No explanation for patient's right wrist pain. Electronically Signed   By: Sandi Mariscal M.D.   On: 04/26/2022 06:54          No data to display          No results found for: "NITRICOXIDE"      Assessment & Plan:   Excessive daytime sleepiness He has snoring, excessive daytime sleepiness, nocturnal apneic events, morning dry mouth. BMI 39. Epworth 13. Given this,  I am concerned he could have sleep disordered breathing with obstructive sleep apnea. He will need sleep study for further evaluation.    - discussed how weight can impact sleep and risk for sleep disordered breathing - discussed options to assist with weight loss: combination of diet modification, cardiovascular and strength training exercises   - had an extensive discussion regarding the adverse health consequences related to untreated sleep disordered breathing - specifically discussed the risks for hypertension, coronary artery disease, cardiac dysrhythmias, cerebrovascular disease, and diabetes - lifestyle modification discussed   - discussed how sleep disruption can increase risk of accidents, particularly when driving - safe driving practices were discussed  Patient Instructions  Given your symptoms, I am concerned that you may have sleep disordered breathing with sleep apnea. You will need a sleep study for further evaluation. Someone will contact you to schedule this.   We discussed how untreated sleep apnea puts an individual at risk for cardiac arrhthymias, pulm HTN, DM, stroke and increases their risk for daytime  accidents. We also briefly reviewed treatment options including weight loss, side sleeping position, oral appliance, CPAP therapy or referral to ENT for possible surgical options  Use caution when driving and pull over if you become sleepy.  Follow up in 8 weeks with Katie Kenni Newton,NP to go over sleep study results, or sooner, if needed      Obesity (BMI 30-39.9) BMI 39.8. Continued healthy weight loss encouraged.   I spent 35 minutes of dedicated to the care of this patient on the date of this encounter to include pre-visit review of records, face-to-face time with the patient discussing conditions above, post visit ordering of testing, clinical documentation with the electronic health record, making appropriate referrals as documented, and communicating necessary findings to members of the patients care team.  Clayton Bibles, NP 05/16/2022  Pt aware and understands NP's role.

## 2022-05-12 NOTE — Patient Instructions (Signed)
Given your symptoms, I am concerned that you may have sleep disordered breathing with sleep apnea. You will need a sleep study for further evaluation. Someone will contact you to schedule this.   We discussed how untreated sleep apnea puts an individual at risk for cardiac arrhthymias, pulm HTN, DM, stroke and increases their risk for daytime accidents. We also briefly reviewed treatment options including weight loss, side sleeping position, oral appliance, CPAP therapy or referral to ENT for possible surgical options  Use caution when driving and pull over if you become sleepy.  Follow up in 8 weeks with Katie Turon Kilmer,NP to go over sleep study results, or sooner, if needed  

## 2022-05-16 ENCOUNTER — Encounter: Payer: Self-pay | Admitting: Nurse Practitioner

## 2022-05-16 DIAGNOSIS — G4719 Other hypersomnia: Secondary | ICD-10-CM | POA: Insufficient documentation

## 2022-05-16 NOTE — Assessment & Plan Note (Signed)
BMI 39.8. Continued healthy weight loss encouraged.

## 2022-05-16 NOTE — Assessment & Plan Note (Signed)
He has snoring, excessive daytime sleepiness, nocturnal apneic events, morning dry mouth. BMI 39. Epworth 13. Given this,  I am concerned he could have sleep disordered breathing with obstructive sleep apnea. He will need sleep study for further evaluation.    - discussed how weight can impact sleep and risk for sleep disordered breathing - discussed options to assist with weight loss: combination of diet modification, cardiovascular and strength training exercises   - had an extensive discussion regarding the adverse health consequences related to untreated sleep disordered breathing - specifically discussed the risks for hypertension, coronary artery disease, cardiac dysrhythmias, cerebrovascular disease, and diabetes - lifestyle modification discussed   - discussed how sleep disruption can increase risk of accidents, particularly when driving - safe driving practices were discussed  Patient Instructions  Given your symptoms, I am concerned that you may have sleep disordered breathing with sleep apnea. You will need a sleep study for further evaluation. Someone will contact you to schedule this.   We discussed how untreated sleep apnea puts an individual at risk for cardiac arrhthymias, pulm HTN, DM, stroke and increases their risk for daytime accidents. We also briefly reviewed treatment options including weight loss, side sleeping position, oral appliance, CPAP therapy or referral to ENT for possible surgical options  Use caution when driving and pull over if you become sleepy.  Follow up in 8 weeks with Katie Santosh Petter,NP to go over sleep study results, or sooner, if needed

## 2022-06-04 ENCOUNTER — Ambulatory Visit: Payer: No Typology Code available for payment source | Admitting: Family Medicine

## 2022-06-10 ENCOUNTER — Encounter (INDEPENDENT_AMBULATORY_CARE_PROVIDER_SITE_OTHER): Payer: No Typology Code available for payment source

## 2022-06-10 DIAGNOSIS — R0683 Snoring: Secondary | ICD-10-CM

## 2022-06-13 ENCOUNTER — Encounter: Payer: Self-pay | Admitting: Family Medicine

## 2022-06-16 ENCOUNTER — Other Ambulatory Visit: Payer: Self-pay

## 2022-06-16 MED ORDER — FLUTICASONE PROPIONATE 50 MCG/ACT NA SUSP: 2.0000 | Freq: Every day | NASAL | 3 refills | Status: AC

## 2022-07-07 ENCOUNTER — Ambulatory Visit (INDEPENDENT_AMBULATORY_CARE_PROVIDER_SITE_OTHER): Payer: No Typology Code available for payment source | Admitting: Nurse Practitioner

## 2022-07-07 ENCOUNTER — Encounter: Payer: Self-pay | Admitting: Nurse Practitioner

## 2022-07-07 VITALS — BP 122/82 | HR 77 | Temp 98.4°F | Ht 70.0 in | Wt 298.0 lb

## 2022-07-07 DIAGNOSIS — G4733 Obstructive sleep apnea (adult) (pediatric): Secondary | ICD-10-CM | POA: Diagnosis not present

## 2022-07-07 DIAGNOSIS — R0683 Snoring: Secondary | ICD-10-CM

## 2022-07-07 DIAGNOSIS — Z6841 Body Mass Index (BMI) 40.0 and over, adult: Secondary | ICD-10-CM

## 2022-07-07 NOTE — Assessment & Plan Note (Signed)
BMI 42.7. Healthy weight loss encouraged.

## 2022-07-07 NOTE — Progress Notes (Signed)
@Patient  ID: Henry Garner, male    DOB: [birth year], 42 y.o.   MRN: [id number]  Chief Complaint  Patient presents with   Follow-up    Hst review, treatment options.     Referring provider: Shelva Majestic, MD  HPI: 42 year old male, never smoker referred for sleep consult. Past medical history significant for allergic rhinitis, obesity, HLD.  TEST/EVENTS:  05/25/2022 HST: AHI 9.7/h, SpO2 low 92% >> mild OSA  05/12/2022: OV with Jaydon Soroka NP for sleep consult.  He has had trouble with his sleep for many years.  He has been told that he snores and stops breathing at night.  He feels tired during the day.  Feels like his sleep is not very restful.  He has trouble staying asleep at night as well.  He does have some dry mouth when he wakes up in the morning.  Occasionally will feel little drowsy when he drives, mainly with longer trips after work.  He denies ever falling asleep while driving. He denies any sleep parasomnias/paralysis, morning headaches. No history of narcolepsy or symptoms of cataplexy. He goes to bed between 10 to 11 PM.  Falls asleep quickly, usually within 15 to 20 minutes.  Wakes 3-4 times a night.  Officially gets up around 6:30 AM.  He has used melatonin in the past but does not seem to help keep him asleep.  No current sleep aids.  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  He is down 30 pounds over the last 2 years.  He did have a sleep study over 10 years ago but was never started on any sort of CPAP therapy.  He does not recall the exact results of this. No history of hypertension, diabetes or stroke.  No significant family history. He is a never smoker.  Drinks alcohol once a month.  He has a couple diet soft drinks every day, which contain caffeine.  He is widowed.  Lives at home with his mother-in-law, daughter and son.  He works at [employer] course in [place].  Epworth 13  07/07/2022: Today - follow up Patient presents today for follow up to discuss home  sleep study results, which revealed a mild OSA with normal oxygen levels. He feels unchanged compared to his last visit. Still having trouble with his nighttime sleep. He wakes feeling tired and is fatigued during the day. He would like to see what his treatment options are.   Allergies  Allergen Reactions   Amoxicillin Hives    Immunization History  Administered Date(s) Administered   PFIZER(Purple Top)SARS-COV-2 Vaccination 05/06/2019, 06/03/2019, 01/30/2020   Tdap 05/23/2016    Past Medical History:  Diagnosis Date   Chicken pox    Kidney stone    1 cm stone left in kidney.    OSA (obstructive sleep apnea)    borderline, advised weight loss    Tobacco History: Social History   Tobacco Use  Smoking Status Never  Smokeless Tobacco Never   Counseling given: Not Answered   Outpatient Medications Prior to Visit  Medication Sig Dispense Refill   fluticasone (FLONASE) 50 MCG/ACT nasal spray Place 2 sprays into both nostrils daily. 48 g 3   ibuprofen (ADVIL,MOTRIN) 200 MG tablet Take 800 mg by mouth every 6 (six) hours as needed.     loratadine (CLARITIN) 10 MG tablet Take 10 mg by mouth daily.     Melatonin 5 MG CAPS Take by mouth.     No facility-administered medications  prior to visit.     Review of Systems:   Constitutional: No night sweats, fevers, chills,  or lassitude. +daytime fatigue, intentional weight loss HEENT: No headaches, difficulty swallowing, tooth/dental problems, or sore throat. No sneezing, itching, ear ache, nasal congestion, or post nasal drip. +AM dry mouth CV:  No chest pain, orthopnea, PND, swelling in lower extremities, anasarca, dizziness, palpitations, syncope Resp: +snoring, witnessed nocturnal apneas. No shortness of breath with exertion or at rest. No excess mucus or change in color of mucus. No productive or non-productive. No hemoptysis. No wheezing.  No chest wall deformity GI:  No heartburn, indigestion, abdominal pain, nausea, vomiting,  diarrhea, change in bowel habits, loss of appetite, bloody stools.  GU: No dysuria, change in color of urine, urgency or frequency.   Skin: No rash, lesions, ulcerations MSK:  No joint pain or swelling.   Neuro: No dizziness or lightheadedness.  Psych: No depression or anxiety. Mood stable. +sleep disturbance    Physical Exam:  BP 122/82   Pulse 77   Temp 98.4 F (36.9 C) (Oral)   Ht 5\' 10"  (1.778 m)   Wt 298 lb (135.2 kg)   SpO2 98%   BMI 42.76 kg/m   GEN: Pleasant, interactive, well-appearing; obese; in no acute distress. HEENT:  Normocephalic and atraumatic. PERRLA. Sclera white. Nasal turbinates pink, moist and patent bilaterally. No rhinorrhea present. Oropharynx pink and moist, without exudate or edema. No lesions, ulcerations, or postnasal drip. Mallampati III NECK:  Supple w/ fair ROM. No JVD present. Normal carotid impulses w/o bruits. Thyroid symmetrical with no goiter or nodules palpated. No lymphadenopathy.   CV: RRR, no m/r/g, no peripheral edema. Pulses intact, +2 bilaterally. No cyanosis, pallor or clubbing. PULMONARY:  Unlabored, regular breathing. Clear bilaterally A&P w/o wheezes/rales/rhonchi. No accessory muscle use.  GI: BS present and normoactive. Soft, non-tender to palpation. No organomegaly or masses detected. MSK: No erythema, warmth or tenderness. Cap refil <2 sec all extrem. No deformities or joint swelling noted.  Neuro: A/Ox3. No focal deficits noted.   Skin: Warm, no lesions or rashe Psych: Normal affect and behavior. Judgement and thought content appropriate.     Lab Results:  CBC    Component Value Date/Time   WBC 6.9 04/21/2022 0953   RBC 5.51 04/21/2022 0953   HGB 16.3 04/21/2022 0953   HCT 47.9 04/21/2022 0953   PLT 213.0 04/21/2022 0953   MCV 86.9 04/21/2022 0953   MCH 29.6 06/18/2014 0745   MCHC 34.0 04/21/2022 0953   RDW 14.0 04/21/2022 0953   LYMPHSABS 2.0 04/21/2022 0953   MONOABS 1.0 04/21/2022 0953   EOSABS 0.1 04/21/2022  0953   BASOSABS 0.0 04/21/2022 0953    BMET    Component Value Date/Time   NA 140 04/21/2022 0953   K 4.4 04/21/2022 0953   CL 104 04/21/2022 0953   CO2 29 04/21/2022 0953   GLUCOSE 93 04/21/2022 0953   BUN 15 04/21/2022 0953   CREATININE 0.91 04/21/2022 0953   CALCIUM 9.5 04/21/2022 0953   GFRNONAA >60 06/18/2014 0745   GFRAA >60 06/18/2014 0745    BNP No results found for: "BNP"   Imaging:  No results found.        No data to display          No results found for: "NITRICOXIDE"      Assessment & Plan:   Mild obstructive sleep apnea Mild OSA with average index of 9.7/h and normal oxygen levels. He has significant daytime symptoms  and poor quality sleep. He would like to move forward with CPAP therapy given this. We will start him on auto setting 5-15 cmH2O, mask of choice and heated humidity. Educated on proper care/use of device. Cautioned on safe driving practices.   Patient Instructions  Start CPAP 5-15 cmH2O, mask of choice, and heated humidity, every night, minimum of 4-6 hours a night.  Change equipment every 30 days or as directed by DME. Wash your tubing with warm soap and water daily, hang to dry. Wash humidifier portion weekly.  Be aware of reduced alertness and do not drive or operate heavy machinery if experiencing this or drowsiness.  Exercise encouraged, as tolerated. Healthy weight management discussed.  Notify if persistent daytime sleepiness occurs even with consistent use of CPAP.  We discussed how untreated sleep apnea puts an individual at risk for cardiac arrhthymias, pulm HTN, DM, stroke and increases their risk for daytime accidents. We also briefly reviewed treatment options including weight loss, side sleeping position, oral appliance, CPAP therapy or referral to ENT for possible surgical options  Follow up in 12 weeks with Dr. Wynona Neat or Philis Nettle, or sooner, if needed    Class 3 severe obesity with body mass index (BMI) of  40.0 to 44.9 in adult (HCC) BMI 42.7. Healthy weight loss encouraged.    I spent 35 minutes of dedicated to the care of this patient on the date of this encounter to include pre-visit review of records, face-to-face time with the patient discussing conditions above, post visit ordering of testing, clinical documentation with the electronic health record, making appropriate referrals as documented, and communicating necessary findings to members of the patients care team.  Noemi Chapel, NP 07/07/2022  Pt aware and understands NP's role.

## 2022-07-07 NOTE — Patient Instructions (Signed)
Start CPAP 5-15 cmH2O, mask of choice, and heated humidity, every night, minimum of 4-6 hours a night.  Change equipment every 30 days or as directed by DME. Wash your tubing with warm soap and water daily, hang to dry. Wash humidifier portion weekly.  Be aware of reduced alertness and do not drive or operate heavy machinery if experiencing this or drowsiness.  Exercise encouraged, as tolerated. Healthy weight management discussed.  Notify if persistent daytime sleepiness occurs even with consistent use of CPAP.  We discussed how untreated sleep apnea puts an individual at risk for cardiac arrhthymias, pulm HTN, DM, stroke and increases their risk for daytime accidents. We also briefly reviewed treatment options including weight loss, side sleeping position, oral appliance, CPAP therapy or referral to ENT for possible surgical options  Follow up in 12 weeks with Dr. Wynona Neat or Philis Nettle, or sooner, if needed

## 2022-07-07 NOTE — Assessment & Plan Note (Signed)
Mild OSA with average index of 9.7/h and normal oxygen levels. He has significant daytime symptoms and poor quality sleep. He would like to move forward with CPAP therapy given this. We will start him on auto setting 5-15 cmH2O, mask of choice and heated humidity. Educated on proper care/use of device. Cautioned on safe driving practices.   Patient Instructions  Start CPAP 5-15 cmH2O, mask of choice, and heated humidity, every night, minimum of 4-6 hours a night.  Change equipment every 30 days or as directed by DME. Wash your tubing with warm soap and water daily, hang to dry. Wash humidifier portion weekly.  Be aware of reduced alertness and do not drive or operate heavy machinery if experiencing this or drowsiness.  Exercise encouraged, as tolerated. Healthy weight management discussed.  Notify if persistent daytime sleepiness occurs even with consistent use of CPAP.  We discussed how untreated sleep apnea puts an individual at risk for cardiac arrhthymias, pulm HTN, DM, stroke and increases their risk for daytime accidents. We also briefly reviewed treatment options including weight loss, side sleeping position, oral appliance, CPAP therapy or referral to ENT for possible surgical options  Follow up in 12 weeks with Dr. Wynona Neat or Philis Nettle, or sooner, if needed

## 2022-07-16 DIAGNOSIS — G4733 Obstructive sleep apnea (adult) (pediatric): Secondary | ICD-10-CM

## 2022-08-01 NOTE — Telephone Encounter (Signed)
Message received from patient.  Good Evening!  So my insurance did turn down the order for my CPAP machine.  What steps do I need to take to go the orthodontic route?  Thank you!   Henry Garner  Message routed to [facility] to advise

## 2022-09-29 ENCOUNTER — Ambulatory Visit: Payer: No Typology Code available for payment source | Admitting: Nurse Practitioner

## 2022-12-01 DIAGNOSIS — F411 Generalized anxiety disorder: Secondary | ICD-10-CM | POA: Diagnosis not present

## 2022-12-26 ENCOUNTER — Encounter: Payer: Self-pay | Admitting: Physician Assistant

## 2022-12-26 ENCOUNTER — Ambulatory Visit: Payer: BC Managed Care – PPO | Admitting: Physician Assistant

## 2022-12-26 VITALS — BP 130/86 | HR 77 | Temp 97.3°F | Ht 70.0 in | Wt 249.0 lb

## 2022-12-26 DIAGNOSIS — F5101 Primary insomnia: Secondary | ICD-10-CM | POA: Diagnosis not present

## 2022-12-26 DIAGNOSIS — F411 Generalized anxiety disorder: Secondary | ICD-10-CM

## 2022-12-26 MED ORDER — HYDROXYZINE HCL 25 MG PO TABS: 25.0000 mg | ORAL_TABLET | Freq: Every day | ORAL | 0 refills | Status: DC | PRN

## 2022-12-26 MED ORDER — SERTRALINE HCL 25 MG PO TABS: 25.0000 mg | ORAL_TABLET | Freq: Every day | ORAL | 1 refills | Status: DC

## 2022-12-26 NOTE — Progress Notes (Signed)
Henry Garner is a 42 y.o. male here for a follow-up of a pre-existing problem.  History of Present Illness:   Chief Complaint  Patient presents with   Anxiety    Pt c/o having a nervous feeling, has been going on since June. He has a lot of life events going on: new relationship, wife passed away 3.5 yrs ago, new job, was trying to buy a house and feel through, mother has five lumps in breast and enlarged lymph nodes and needs biopsy's next week.   Insomnia    Pt has been having trouble sleeping since June, having trouble staying asleep, sleeping 4-5 hours a night.   HPI  Anxiety; Insomnia Complains of feeling constant nervousness and difficulty sleeping/staying asleep since June. Notes sleeping 4-5 hours nightly. Hx of mild OSA - expresses that he hasn't had OSA issues since losing weight. Reports lots of changes in his life: his wife passed 3.5 years ago leaving him as a single father (daughter and son), a new relationship that didn't work out, new job, tried to buy a house but that fell through (was able to close on a house recently), his father is recovering from prostate cancer, and his mother has 5 lumps in breast with enlarged lymph-nodes and will be biopsied next week.   Describes instance of waking up at 4 AM and immediately felt nervousness onset, and then couldn't fall back asleep.  States he's never tried medication management (until recently when he tried OTC Melatonin, CBD gummies, and other sleep aids) but he is established with a therapist.    Notes his daughter is established with a therapist for her own anxiety and ADHD. Denies heavy drinking to manage sx or associated palpitations, SOB, or nausea.  Past Medical History:  Diagnosis Date   Chicken pox    Kidney stone    1 cm stone left in kidney.    OSA (obstructive sleep apnea)    borderline, advised weight loss    Social History   Tobacco Use   Smoking status: Never   Smokeless tobacco: Never  Substance  Use Topics   Alcohol use: Yes    Alcohol/week: 0.0 standard drinks of alcohol    Comment: 2-3x a month   Drug use: No   Past Surgical History:  Procedure Laterality Date   ANTERIOR CRUCIATE LIGAMENT REPAIR  2000   basketball in college   CATARACT EXTRACTION  1982   Left eye-congenital. Eye now brown   HERNIA REPAIR  1980   as child   VASECTOMY  2015   Family History  Problem Relation Age of Onset   Hyperthyroidism Mother        s/p removal- not cancerous   Hearing loss Father    Other Father        EP issues- Vtach   Prostate cancer Father        diagnozed at 9   Alzheimer's disease Paternal Grandfather    Allergies  Allergen Reactions   Amoxicillin Hives   Current Medications:   Current Outpatient Medications:    fluticasone (FLONASE) 50 MCG/ACT nasal spray, Place 2 sprays into both nostrils daily., Disp: 48 g, Rfl: 3   hydrOXYzine (ATARAX) 25 MG tablet, Take 1 tablet (25 mg total) by mouth daily as needed (insomnia)., Disp: 30 tablet, Rfl: 0   ibuprofen (ADVIL,MOTRIN) 200 MG tablet, Take 800 mg by mouth every 6 (six) hours as needed., Disp: , Rfl:    loratadine (CLARITIN) 10 MG tablet, Take 10  mg by mouth daily., Disp: , Rfl:    Melatonin 5 MG CAPS, Take by mouth., Disp: , Rfl:    sertraline (ZOLOFT) 25 MG tablet, Take 1 tablet (25 mg total) by mouth daily., Disp: 30 tablet, Rfl: 1  Review of Systems:   ROS See pertinent positives and negatives as per the HPI.  Vitals:   Vitals:   12/26/22 0858  BP: 130/86  Pulse: 77  Temp: (!) 97.3 F (36.3 C)  TempSrc: Temporal  SpO2: 97%  Weight: 249 lb (112.9 kg)  Height: 5\' 10"  (1.778 m)     Body mass index is 35.73 kg/m.  Physical Exam:   Physical Exam Vitals and nursing note reviewed.  Constitutional:      General: He is not in acute distress.    Appearance: He is well-developed. He is not ill-appearing or toxic-appearing.  Cardiovascular:     Rate and Rhythm: Normal rate and regular rhythm.     Pulses:  Normal pulses.     Heart sounds: Normal heart sounds, S1 normal and S2 normal.  Pulmonary:     Effort: Pulmonary effort is normal.     Breath sounds: Normal breath sounds.  Skin:    General: Skin is warm and dry.  Neurological:     Mental Status: He is alert.     GCS: GCS eye subscore is 4. GCS verbal subscore is 5. GCS motor subscore is 6.  Psychiatric:        Speech: Speech normal.        Behavior: Behavior normal. Behavior is cooperative.     Assessment and Plan:   GAD (generalized anxiety disorder) Uncontrolled Will start Zoloft 25 mg daily - risks/benefits/side effect(s) discussed Follow-up in 4-6 weeks, sooner if concerns I discussed with patient that if they develop any SI, to tell someone immediately and seek medical attention.  Primary insomnia Uncontrolled Will trial hydroxyzine 25 mg daily as needed for sleep If new/worsening symptom(s), needs close follow-up with Primary Care Provider (PCP)  Discussed that follow-up of these medical issues will best be served under Primary Care Provider (PCP) care  I,Emily Lagle,acting as a scribe for Jarold Motto, PA.,have documented all relevant documentation on the behalf of Jarold Motto, PA,as directed by  Jarold Motto, PA while in the presence of Jarold Motto, [place].  I, Jarold Motto, [place], have reviewed all documentation for this visit. The documentation on 12/26/22 for the exam, diagnosis, procedures, and orders are all accurate and complete.  Jarold Motto, PA-C

## 2022-12-26 NOTE — Patient Instructions (Signed)
It was great to see you!  Start Zoloft 25 mg daily  Start hydroxyzine 25 mg daily at night as needed for sleep  If you develop suicidal thoughts, please tell someone and immediately proceed to our local 24/7 crisis center, [facility]. [address] [place], [place] [address] [phone].  Let's follow-up in 4-6 weeks with Primary Care Provider (PCP), sooner if you have concerns.  Take care,  Jarold Motto PA-C

## 2022-12-29 DIAGNOSIS — F411 Generalized anxiety disorder: Secondary | ICD-10-CM | POA: Diagnosis not present

## 2023-02-02 ENCOUNTER — Encounter: Payer: Self-pay | Admitting: Family Medicine

## 2023-02-02 ENCOUNTER — Ambulatory Visit: Payer: BC Managed Care – PPO | Admitting: Family Medicine

## 2023-02-02 VITALS — BP 132/78 | HR 72 | Temp 97.2°F | Wt 267.2 lb

## 2023-02-02 DIAGNOSIS — F5101 Primary insomnia: Secondary | ICD-10-CM

## 2023-02-02 DIAGNOSIS — M25522 Pain in left elbow: Secondary | ICD-10-CM

## 2023-02-02 DIAGNOSIS — F411 Generalized anxiety disorder: Secondary | ICD-10-CM | POA: Diagnosis not present

## 2023-02-02 MED ORDER — ZOLPIDEM TARTRATE 5 MG PO TABS: 5.0000 mg | ORAL_TABLET | Freq: Every evening | ORAL | 2 refills | Status: DC | PRN

## 2023-02-02 NOTE — Progress Notes (Signed)
Phone [phone] In person visit   Subjective:   Henry Garner is a 42 y.o. year old very pleasant male patient who presents for/with See problem oriented charting Chief Complaint  Patient presents with   Anxiety    Pt here for 4-6 week f/u     Past Medical History-  Patient Active Problem List   Diagnosis Date Noted   Class 3 severe obesity with body mass index (BMI) of 40.0 to 44.9 in adult [facility]) 12/04/2016    Priority: Medium    Hyperlipidemia 12/04/2016    Priority: Medium    Allergic rhinitis 06/11/2017    Priority: Low   Right elbow pain 11/14/2014    Priority: Low   Lateral epicondylitis of right elbow 08/28/2014    Priority: Low   GAD (generalized anxiety disorder) 02/02/2023   Excessive daytime sleepiness 05/16/2022   Achilles tendon disorder 08/28/2014   Mild obstructive sleep apnea 03/13/2008    Medications- reviewed and updated Current Outpatient Medications  Medication Sig Dispense Refill   fluticasone (FLONASE) 50 MCG/ACT nasal spray Place 2 sprays into both nostrils daily. 48 g 3   hydrOXYzine (ATARAX) 25 MG tablet Take 1 tablet (25 mg total) by mouth daily as needed (insomnia). 30 tablet 0   ibuprofen (ADVIL,MOTRIN) 200 MG tablet Take 800 mg by mouth every 6 (six) hours as needed.     loratadine (CLARITIN) 10 MG tablet Take 10 mg by mouth daily.     Melatonin 5 MG CAPS Take by mouth.     sertraline (ZOLOFT) 25 MG tablet Take 1 tablet (25 mg total) by mouth daily. 30 tablet 1   zolpidem (AMBIEN) 5 MG tablet Take 1 tablet (5 mg total) by mouth at bedtime as needed for sleep. 30 tablet 2   No current facility-administered medications for this visit.     Objective:  BP 132/78   Pulse 72   Temp (!) 97.2 F (36.2 C)   Wt 267 lb 3.2 oz (121.2 kg)   SpO2 97%   BMI 38.34 kg/m  Gen: NAD, resting comfortably Pain with palpation over left lateral epicondyle but also with tenderness to palpation medial to the musculature and more significant than over  the epicondyle    Assessment and Plan   # Anxiety # Insomnia S:Medication: Sertraline 25 mg and hydroxyzine 25 mg daily as needed -Patient was seen December 26, 2022 by Jarold Motto, PA.  He had lost his wife back in 2021 and was trying to embark on a new relationship, had a new job, surrounded by house, mother with health concerns and need for breast biopsy.  He noted trouble sleeping just for 5 hours a night.  Symptoms that started in June.  He had tried over-the-counter melatonin and CBD Gummies and some other sleep aids. Counseling: Had established prior to last visit  Today he reports improvement in anxiety. Still not sleeping well. Hydroxyzine unfortuntely has not helped. OSA only mild- and lost more weight- insurance didn't cover CPAP.   No trouble falling asleep around 10  but wakes up around 2 am. Is able to fall asleep but may take 30 minutes but wakes up every 2-3 hours. Was taking hydroxyzine an hour before laying down. Tries to not be on screen within 30 minutes of bed and if gets up doesn't use screen other than to check time. Uses podcast and sleep mask to help him sleep.  = does update me that mom is having mastectomy -relationship at about a year  02/02/2023   11:23 AM 12/26/2022    9:03 AM 04/21/2022    9:17 AM  Depression screen PHQ 2/9  Decreased Interest 0 1 0  Down, Depressed, Hopeless 0 1 0  PHQ - 2 Score 0 2 0  Altered sleeping 3 3 0  Tired, decreased energy 2 1 0  Change in appetite 0 1 0  Feeling bad or failure about yourself  0 0 0  Trouble concentrating 0 1 0  Moving slowly or fidgety/restless 0 0 0  Suicidal thoughts 0 0 0  PHQ-9 Score 5 8 0  Difficult doing work/chores Not difficult at all Somewhat difficult Not difficult at all       02/02/2023   11:23 AM 12/26/2022    9:03 AM 04/21/2022    9:17 AM  GAD 7 : Generalized Anxiety Score  Nervous, Anxious, on Edge 2 3 0  Control/stop worrying 3 3 0  Worry too much - different things 3 3 0   Trouble relaxing 2 3 0  Restless 0 0 0  Easily annoyed or irritable 2 2 0  Afraid - awful might happen 1 0 0  Total GAD 7 Score 13 14 0  Anxiety Difficulty Somewhat difficult Somewhat difficult Not difficult at all   A/P: Patient reports anxiety has significantly improved-though not fully reflected in GAD-7.  He feels like his main issue at this point is poor sleep and hydroxyzine was not helpful.  We discussed several options but in the end opted to try Ambien-since he wakes up within 4 hours I think Ambien instant release should be enough to help him continue to rest but could try extended release - He was also on a rather low dose of sertraline and we considered increasing this dose but in the end we opted to see how he does with a better nights rest first and then reconsider at the first of the year-she will reach out by MyChart and we will perhaps trial 50 mg dose  #Left arm pain- was moving bed and bedframe and something pop in November-does have pain over lateral epicondyle but also pain medial to this in the musculature and with the prior pop that he heard will refer to Dr. Ave Filter who did his prior surgery on his right arm  Recommended follow up: Return in about 4 months (around 06/03/2023) for physical or sooner if needed.Schedule b4 you leave. Future Appointments  Date Time Provider Department Center  08/04/2023 10:00 AM Shelva Majestic, MD [facility]    Lab/Order associations:   ICD-10-CM   1. GAD (generalized anxiety disorder)  F41.1     2. Primary insomnia  F51.01     3. Left elbow pain  M25.522 Ambulatory referral to Orthopedic Surgery      Meds ordered this encounter  Medications   zolpidem (AMBIEN) 5 MG tablet    Sig: Take 1 tablet (5 mg total) by mouth at bedtime as needed for sleep.    Dispense:  30 tablet    Refill:  2    Return precautions advised.  Tana Conch, MD

## 2023-02-02 NOTE — Patient Instructions (Addendum)
Continue sertraline 25 mg for now and trial Ambien as needed for sleep. Perhaps trial 3 nights and then move of the as needed.   Update me first of the year if this is helping- we also discussed going up to sertraline 50 mg through [employer] as well at that time  We have placed a referral for you today to Dr. Ave Filter [facility] - I would call them in 1 week if you have not heard.   Recommended follow up: Return in about 4 months (around 06/03/2023) for physical or sooner if needed.Schedule b4 you leave.

## 2023-02-17 ENCOUNTER — Other Ambulatory Visit: Payer: Self-pay | Admitting: Physician Assistant

## 2023-02-25 ENCOUNTER — Encounter: Payer: Self-pay | Admitting: Family Medicine

## 2023-02-25 MED ORDER — ZOLPIDEM TARTRATE ER 6.25 MG PO TBCR: 6.2500 mg | EXTENDED_RELEASE_TABLET | Freq: Every evening | ORAL | 0 refills | Status: DC | PRN

## 2023-03-04 DIAGNOSIS — M25522 Pain in left elbow: Secondary | ICD-10-CM | POA: Diagnosis not present

## 2023-03-05 ENCOUNTER — Other Ambulatory Visit: Payer: Self-pay | Admitting: Orthopedic Surgery

## 2023-03-05 DIAGNOSIS — M25522 Pain in left elbow: Secondary | ICD-10-CM

## 2023-03-23 ENCOUNTER — Ambulatory Visit
Admission: RE | Admit: 2023-03-23 | Discharge: 2023-03-23 | Disposition: A | Discharge status: Home / Self Care | Payer: BC Managed Care – PPO | Source: Ambulatory Visit | Attending: Orthopedic Surgery | Admitting: Orthopedic Surgery

## 2023-03-23 DIAGNOSIS — M25522 Pain in left elbow: Secondary | ICD-10-CM

## 2023-03-23 DIAGNOSIS — S46212A Strain of muscle, fascia and tendon of other parts of biceps, left arm, initial encounter: Secondary | ICD-10-CM | POA: Diagnosis not present

## 2023-03-30 DIAGNOSIS — S46212A Strain of muscle, fascia and tendon of other parts of biceps, left arm, initial encounter: Secondary | ICD-10-CM | POA: Diagnosis not present

## 2023-04-01 MED ORDER — SERTRALINE HCL 25 MG PO TABS: 25.0000 mg | ORAL_TABLET | Freq: Every day | ORAL | 1 refills | Status: DC

## 2023-04-16 DIAGNOSIS — M79622 Pain in left upper arm: Secondary | ICD-10-CM | POA: Diagnosis not present

## 2023-04-23 DIAGNOSIS — M79622 Pain in left upper arm: Secondary | ICD-10-CM | POA: Diagnosis not present

## 2023-06-22 ENCOUNTER — Other Ambulatory Visit: Payer: Self-pay | Admitting: Family Medicine

## 2023-08-04 ENCOUNTER — Encounter: Payer: Self-pay | Admitting: Family Medicine

## 2023-08-04 ENCOUNTER — Ambulatory Visit (INDEPENDENT_AMBULATORY_CARE_PROVIDER_SITE_OTHER): Payer: BC Managed Care – PPO | Admitting: Family Medicine

## 2023-08-04 VITALS — BP 122/78 | HR 73 | Temp 97.5°F | Ht 70.0 in | Wt 281.6 lb

## 2023-08-04 DIAGNOSIS — E785 Hyperlipidemia, unspecified: Secondary | ICD-10-CM | POA: Diagnosis not present

## 2023-08-04 DIAGNOSIS — R3915 Urgency of urination: Secondary | ICD-10-CM | POA: Diagnosis not present

## 2023-08-04 DIAGNOSIS — Z131 Encounter for screening for diabetes mellitus: Secondary | ICD-10-CM | POA: Diagnosis not present

## 2023-08-04 DIAGNOSIS — Z0000 Encounter for general adult medical examination without abnormal findings: Secondary | ICD-10-CM | POA: Diagnosis not present

## 2023-08-04 DIAGNOSIS — Z1211 Encounter for screening for malignant neoplasm of colon: Secondary | ICD-10-CM

## 2023-08-04 LAB — POC URINALSYSI DIPSTICK (AUTOMATED)
Bilirubin, UA: NEGATIVE
Blood, UA: NEGATIVE
Glucose, UA: NEGATIVE
Ketones, UA: NEGATIVE
Leukocytes, UA: NEGATIVE
Nitrite, UA: NEGATIVE
Protein, UA: NEGATIVE
Spec Grav, UA: 1.015 (ref 1.010–1.025)
Urobilinogen, UA: 0.2 U/dL
pH, UA: 6 (ref 5.0–8.0)

## 2023-08-04 MED ORDER — ZOLPIDEM TARTRATE ER 6.25 MG PO TBCR: 6.2500 mg | EXTENDED_RELEASE_TABLET | Freq: Every evening | ORAL | 2 refills | Status: AC | PRN

## 2023-08-04 MED ORDER — SERTRALINE HCL 25 MG PO TABS: 25.0000 mg | ORAL_TABLET | Freq: Every day | ORAL | 3 refills | Status: AC

## 2023-08-04 NOTE — Progress Notes (Signed)
 Phone: [phone]    Subjective:  Patient presents today for their annual physical. Chief complaint-noted.   See problem oriented charting- ROS- full  review of systems was completed and negative  Per full ROS sheet completed by patient except for topics noted under acute/chronic concerns  The following were reviewed and entered/updated in epic: Past Medical History:  Diagnosis Date   Allergy 10 yrs ago   Really struggle with pollen April-June   Chicken pox    Kidney stone    1 cm stone left in kidney.    OSA (obstructive sleep apnea)    borderline, advised weight loss   Patient Active Problem List   Diagnosis Date Noted   Class 3 severe obesity with body mass index (BMI) of 40.0 to 44.9 in adult 12/04/2016    Priority: Medium    Hyperlipidemia 12/04/2016    Priority: Medium    Allergic rhinitis 06/11/2017    Priority: Low   Right elbow pain 11/14/2014    Priority: Low   Lateral epicondylitis of right elbow 08/28/2014    Priority: Low   GAD (generalized anxiety disorder) 02/02/2023   Excessive daytime sleepiness 05/16/2022   Achilles tendon disorder 08/28/2014   Mild obstructive sleep apnea 03/13/2008   Past Surgical History:  Procedure Laterality Date   ANTERIOR CRUCIATE LIGAMENT REPAIR  2000   basketball in college   CATARACT EXTRACTION  1982   Left eye-congenital. Eye now brown   HERNIA REPAIR  1980   as child   VASECTOMY  2015    Family History  Problem Relation Age of Onset   Hyperthyroidism Mother        s/p removal- not cancerous   Breast cancer Mother        mastectomy left in 2024   Hearing loss Father    Other Father        EP issues- Vtach   Prostate cancer Father        diagnozed at 71   Alzheimer's disease Paternal Grandfather     Medications- reviewed and updated Current Outpatient Medications  Medication Sig Dispense Refill   fluticasone  (FLONASE ) 50 MCG/ACT nasal spray Place 2 sprays into both nostrils daily. 48 g 3   ibuprofen  (ADVIL,MOTRIN) 200 MG tablet Take 800 mg by mouth every 6 (six) hours as needed.     loratadine (CLARITIN) 10 MG tablet Take 10 mg by mouth daily.     Melatonin 5 MG CAPS Take by mouth.     sertraline  (ZOLOFT ) 25 MG tablet TAKE ONE TABLET BY MOUTH DAILY 30 tablet 1   zolpidem  (AMBIEN  CR) 6.25 MG CR tablet Take 1 tablet (6.25 mg total) by mouth at bedtime as needed for sleep. 30 tablet 0   No current facility-administered medications for this visit.    Allergies-reviewed and updated Allergies  Allergen Reactions   Amoxicillin Hives    Social History   Social History Narrative   Family: widowed august of 2021 (waiting on medical examiner after MVC0likely heart). 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 Widowed Wife went to [employer].       Hobbies: time with family, kids getting interested in golfing, singing at [employer]- season tickets to [place]      Objective:  BP 122/78  Pulse 73   Temp (!) 97.5 F (36.4 C)   Ht 5' 10 (1.778 m)   Wt 281 lb 9.6 oz (127.7 kg)   SpO2 97%   BMI 40.41 kg/m  Gen: NAD, resting comfortably HEENT: Mucous membranes are moist. Oropharynx normal Neck: no thyromegaly CV: RRR no murmurs rubs or gallops Lungs: CTAB no crackles, wheeze, rhonchi Abdomen: soft/nontender/nondistended/normal bowel sounds. No rebound or guarding.  Ext: no edema Skin: warm, dry Neuro: grossly normal, moves all extremities, PERRLA Declines rectal today Results for orders placed or performed in visit on 08/04/23 (from the past 24 hours)  POCT Urinalysis Dipstick (Automated)     Status: None   Collection Time: 08/04/23 10:37 AM  Result Value Ref Range   Color, UA yellow    Clarity, UA clear    Glucose, UA Negative Negative   Bilirubin, UA neg    Ketones, UA neg     Spec Grav, UA 1.015 1.010 - 1.025   Blood, UA neg    pH, UA 6.0 5.0 - 8.0   Protein, UA Negative Negative   Urobilinogen, UA 0.2 0.2 or 1.0 E.U./dL   Nitrite, UA neg    Leukocytes, UA Negative Negative       Assessment and Plan:  43 y.o. male presenting for annual physical.  Health Maintenance counseling: 1. Anticipatory guidance: Patient counseled regarding regular dental exams -q6 months, eye exams - yearly,  avoiding smoking and second hand smoke , limiting alcohol to 2 beverages per day- one a month, no illicit drugs.   2. Risk factor reduction:  Advised patient of need for regular exercise and diet rich and fruits and vegetables to reduce risk of heart attack and stroke.  Exercise- [employer] hard this time of year backut prior 2-3 days a week and wants to get back to that.  Diet/weight management-weight up 8 lbs from last year in march- - feels needs to refocus on healthy food moderation- down 5 lbs recently and feeling motivated.  Wt Readings from Last 3 Encounters:  08/04/23 281 lb 9.6 oz (127.7 kg)  02/02/23 267 lb 3.2 oz (121.2 kg)  12/26/22 249 lb (112.9 kg)  3. Immunizations/screenings/ancillary studies- up to date on vaccines, declines human papilloma virus vaccine Immunization History  Administered Date(s) Administered   PFIZER(Purple Top)SARS-COV-2 Vaccination 05/06/2019, 06/03/2019, 01/30/2020   Tdap 05/23/2016  4. Prostate cancer screening-  with intermittent urinary urgency issues then with lack of ability to void- check baseline PSA, prefers to hold off on rectal as symptoms mild intermittent  5. Colon cancer screening - refer today as turns 43 next month 6. Skin cancer screening/prevention- no dermatology visits recently. advised regular sunscreen use. Denies worrisome, changing, or new skin lesions.  7. Testicular cancer screening- advised monthly self exams  8. STD screening- patient opts out- monogamous and has no concerns 9. Smoking associated screening- Never  smoker  Status of chronic or acute concerns   #social update- working at [employer] and moved to [place] and enjoying new spot.   #urinary urgency- urge to urinate but nothing comes out at times going on since April -check urinalysis, culture, PSA -declines rectal exam today at least  # Anxiety/GAD S:Medication:  sertraline  25 mg, hydroxyzine  25 mg for insomnia vs Ambien - also has tried melatonin. Reports hydrozyzine failed so removed from list, IR Ambien  not effective- currently XR helpful using once a week. Some caution due to mild OSA but didn't qualify for CPAP - plus going to work on  weight loss as primarily weight related A/P: generalized anxiety disorder much improved with GAD7 of 0- continue sertraline  and Ambien  as needed- thankfully using rarely which lowers risk and benefit   #Left arm pain- worked with Dr. Marna Singleton then physical therapy   #hyperlipidemia S: Medication:none Lab Results  Component Value Date   CHOL 200 04/21/2022   HDL 40.90 04/21/2022   LDLCALC 143 (H) 04/21/2022   TRIG 77.0 04/21/2022   CHOLHDL 5 04/21/2022   A/P: update lipids and calculate arteriosclerotic cardiovascular disease risk - focus on lifestyle  Recommended follow up: Return in about 1 year (around 08/03/2024) for physical or sooner if needed.Schedule b4 you leave.  Lab/Order associations: fasting   ICD-10-CM   1. Preventative health care  Z00.00     2. Urinary urgency  R39.15 Urine Culture    POCT Urinalysis Dipstick (Automated)    PSA    3. Hyperlipidemia, unspecified hyperlipidemia type  E78.5 Comprehensive metabolic panel with GFR    CBC with Differential/Platelet    Lipid panel    4. Screening for diabetes mellitus  Z13.1 Hemoglobin A1c    5. Morbid obesity (HCC)  E66.01 Hemoglobin A1c    6. Screen for colon cancer  Z12.11 Ambulatory referral to Gastroenterology      Meds ordered this encounter  Medications   zolpidem  (AMBIEN  CR) 6.25 MG CR tablet    Sig:  Take 1 tablet (6.25 mg total) by mouth at bedtime as needed for sleep.    Dispense:  30 tablet    Refill:  2   sertraline  (ZOLOFT ) 25 MG tablet    Sig: Take 1 tablet (25 mg total) by mouth daily.    Dispense:  90 tablet    Refill:  3    This prescription was filled on 06/02/2023. Any refills authorized will be placed on file.    Return precautions advised.   Clarisa Crooked, MD

## 2023-08-04 NOTE — Patient Instructions (Addendum)
 Schedule a lab visit at the check out desk within 2 weeks. Return for future fasting labs meaning nothing but water after midnight please. Ok to take your medications with water.   [facility] contact Please call to schedule visit and/or procedure IF you do not hear within a month Address: [address] [place], [place], [place] [address] Phone: [phone]   Recommended follow up: Return in about 1 year (around 08/03/2024) for physical or sooner if needed.Schedule b4 you leave.

## 2023-09-23 ENCOUNTER — Telehealth: Payer: Self-pay

## 2023-09-23 ENCOUNTER — Other Ambulatory Visit: Payer: Self-pay

## 2023-09-23 NOTE — Telephone Encounter (Signed)
-----   Message from Garnette Lukes sent at 09/23/2023  2:51 PM EDT ----- Does not look like he did any of his labs from his physical-May need to be reordered and called him to come back ----- Message ----- From: SYSTEM Sent: 08/09/2023  12:17 AM EDT To: Garnette MALVA Lukes, MD

## 2023-09-23 NOTE — Telephone Encounter (Signed)
 Pt did not get labs done at [facility] on 06/17. Please call and schedule lab only for pt to get labs done.

## 2023-10-22 ENCOUNTER — Encounter: Payer: Self-pay | Admitting: Family Medicine

## 2024-08-04 ENCOUNTER — Encounter: Admitting: Family Medicine
